# Patient Record
Sex: Female | Born: 1960 | Race: Black or African American | Hispanic: No | Marital: Single | Smoking: Current every day smoker
Health system: Southern US, Community
[De-identification: ages and names within clinical notes are randomized; demographics above are authoritative.]

## PROBLEM LIST (undated history)

## (undated) DIAGNOSIS — Z21 Asymptomatic human immunodeficiency virus [HIV] infection status: Secondary | ICD-10-CM

## (undated) DIAGNOSIS — G629 Polyneuropathy, unspecified: Secondary | ICD-10-CM

## (undated) DIAGNOSIS — B20 Human immunodeficiency virus [HIV] disease: Secondary | ICD-10-CM

## (undated) DIAGNOSIS — I509 Heart failure, unspecified: Secondary | ICD-10-CM

## (undated) HISTORY — PX: CARDIAC SURGERY: SHX584

---

## 2018-03-13 ENCOUNTER — Inpatient Hospital Stay (HOSPITAL_COMMUNITY)
Admission: EM | Admit: 2018-03-13 | Discharge: 2018-03-16 | DRG: 975 | Disposition: A | Discharge status: Home / Self Care | Payer: Medicaid - Out of State | Attending: Internal Medicine | Admitting: Internal Medicine

## 2018-03-13 ENCOUNTER — Encounter (HOSPITAL_COMMUNITY): Payer: Self-pay

## 2018-03-13 ENCOUNTER — Other Ambulatory Visit: Payer: Self-pay

## 2018-03-13 ENCOUNTER — Emergency Department (HOSPITAL_COMMUNITY): Payer: Medicaid - Out of State

## 2018-03-13 DIAGNOSIS — A419 Sepsis, unspecified organism: Principal | ICD-10-CM

## 2018-03-13 DIAGNOSIS — E860 Dehydration: Secondary | ICD-10-CM | POA: Diagnosis present

## 2018-03-13 DIAGNOSIS — F17210 Nicotine dependence, cigarettes, uncomplicated: Secondary | ICD-10-CM | POA: Diagnosis present

## 2018-03-13 DIAGNOSIS — E669 Obesity, unspecified: Secondary | ICD-10-CM | POA: Diagnosis present

## 2018-03-13 DIAGNOSIS — Z833 Family history of diabetes mellitus: Secondary | ICD-10-CM

## 2018-03-13 DIAGNOSIS — Z7984 Long term (current) use of oral hypoglycemic drugs: Secondary | ICD-10-CM

## 2018-03-13 DIAGNOSIS — Z720 Tobacco use: Secondary | ICD-10-CM | POA: Diagnosis present

## 2018-03-13 DIAGNOSIS — E785 Hyperlipidemia, unspecified: Secondary | ICD-10-CM | POA: Diagnosis present

## 2018-03-13 DIAGNOSIS — R0602 Shortness of breath: Secondary | ICD-10-CM

## 2018-03-13 DIAGNOSIS — Z6834 Body mass index (BMI) 34.0-34.9, adult: Secondary | ICD-10-CM

## 2018-03-13 DIAGNOSIS — Z21 Asymptomatic human immunodeficiency virus [HIV] infection status: Secondary | ICD-10-CM

## 2018-03-13 DIAGNOSIS — I509 Heart failure, unspecified: Secondary | ICD-10-CM

## 2018-03-13 DIAGNOSIS — J181 Lobar pneumonia, unspecified organism: Secondary | ICD-10-CM | POA: Diagnosis present

## 2018-03-13 DIAGNOSIS — E1129 Type 2 diabetes mellitus with other diabetic kidney complication: Secondary | ICD-10-CM | POA: Diagnosis present

## 2018-03-13 DIAGNOSIS — K219 Gastro-esophageal reflux disease without esophagitis: Secondary | ICD-10-CM | POA: Diagnosis present

## 2018-03-13 DIAGNOSIS — Z7982 Long term (current) use of aspirin: Secondary | ICD-10-CM

## 2018-03-13 DIAGNOSIS — N179 Acute kidney failure, unspecified: Secondary | ICD-10-CM | POA: Diagnosis present

## 2018-03-13 DIAGNOSIS — N183 Chronic kidney disease, stage 3 unspecified: Secondary | ICD-10-CM | POA: Diagnosis present

## 2018-03-13 DIAGNOSIS — Y95 Nosocomial condition: Secondary | ICD-10-CM | POA: Diagnosis present

## 2018-03-13 DIAGNOSIS — Z79899 Other long term (current) drug therapy: Secondary | ICD-10-CM

## 2018-03-13 DIAGNOSIS — E1122 Type 2 diabetes mellitus with diabetic chronic kidney disease: Secondary | ICD-10-CM

## 2018-03-13 DIAGNOSIS — I5022 Chronic systolic (congestive) heart failure: Secondary | ICD-10-CM | POA: Diagnosis present

## 2018-03-13 DIAGNOSIS — B20 Human immunodeficiency virus [HIV] disease: Secondary | ICD-10-CM | POA: Diagnosis present

## 2018-03-13 DIAGNOSIS — J189 Pneumonia, unspecified organism: Secondary | ICD-10-CM

## 2018-03-13 HISTORY — DX: Heart failure, unspecified: I50.9

## 2018-03-13 HISTORY — DX: Asymptomatic human immunodeficiency virus (hiv) infection status: Z21

## 2018-03-13 HISTORY — DX: Human immunodeficiency virus (HIV) disease: B20

## 2018-03-13 HISTORY — DX: Polyneuropathy, unspecified: G62.9

## 2018-03-13 MED ORDER — LACTATED RINGERS IV BOLUS (SEPSIS)
1000.0000 mL | Freq: Once | INTRAVENOUS | Status: AC
  Administered 2018-03-14: 1000 mL via INTRAVENOUS

## 2018-03-13 MED ORDER — LACTATED RINGERS IV BOLUS (SEPSIS): 1000.0000 mL | Freq: Once | INTRAVENOUS | Status: DC

## 2018-03-13 MED ORDER — ACETAMINOPHEN 325 MG PO TABS
650.0000 mg | ORAL_TABLET | Freq: Once | ORAL | Status: AC | PRN
  Administered 2018-03-13: 650 mg via ORAL
  Filled 2018-03-13: qty 2

## 2018-03-13 MED ORDER — SODIUM CHLORIDE 0.9 % IV SOLN
2.0000 g | INTRAVENOUS | Status: DC
  Administered 2018-03-14: 2 g via INTRAVENOUS
  Filled 2018-03-13: qty 20

## 2018-03-13 MED ORDER — SODIUM CHLORIDE 0.9 % IV SOLN
500.0000 mg | INTRAVENOUS | Status: DC
  Administered 2018-03-14 – 2018-03-15 (×3): 500 mg via INTRAVENOUS
  Filled 2018-03-13 (×3): qty 500

## 2018-03-13 NOTE — ED Notes (Signed)
This RN assisted and reviewed triage.

## 2018-03-13 NOTE — ED Notes (Signed)
RN attempted to try to start IV/blood work. RN consulted with coworker to try to get an IV placed and blood work drawn.

## 2018-03-13 NOTE — ED Triage Notes (Signed)
Pt c/o SHOB. Has had recent contact with family member who had been sick. Reports having chills at home and feeling flushed. This has been going on for four days. States chest is hurting from working hard to breathe. Has been taking robitussin and OTC meds to control symptoms at home.

## 2018-03-14 ENCOUNTER — Encounter (HOSPITAL_COMMUNITY): Payer: Self-pay | Admitting: Internal Medicine

## 2018-03-14 DIAGNOSIS — I5022 Chronic systolic (congestive) heart failure: Secondary | ICD-10-CM | POA: Diagnosis present

## 2018-03-14 DIAGNOSIS — Z720 Tobacco use: Secondary | ICD-10-CM | POA: Diagnosis present

## 2018-03-14 DIAGNOSIS — J189 Pneumonia, unspecified organism: Secondary | ICD-10-CM | POA: Diagnosis present

## 2018-03-14 DIAGNOSIS — E1129 Type 2 diabetes mellitus with other diabetic kidney complication: Secondary | ICD-10-CM | POA: Diagnosis present

## 2018-03-14 DIAGNOSIS — B20 Human immunodeficiency virus [HIV] disease: Secondary | ICD-10-CM | POA: Diagnosis present

## 2018-03-14 DIAGNOSIS — K219 Gastro-esophageal reflux disease without esophagitis: Secondary | ICD-10-CM | POA: Diagnosis not present

## 2018-03-14 DIAGNOSIS — N179 Acute kidney failure, unspecified: Secondary | ICD-10-CM | POA: Diagnosis present

## 2018-03-14 DIAGNOSIS — A419 Sepsis, unspecified organism: Secondary | ICD-10-CM | POA: Diagnosis present

## 2018-03-14 DIAGNOSIS — N183 Chronic kidney disease, stage 3 unspecified: Secondary | ICD-10-CM | POA: Diagnosis present

## 2018-03-14 LAB — RESPIRATORY PANEL BY PCR
ADENOVIRUS-RVPPCR: NOT DETECTED
Bordetella pertussis: NOT DETECTED
CORONAVIRUS HKU1-RVPPCR: NOT DETECTED
CORONAVIRUS NL63-RVPPCR: NOT DETECTED
Chlamydophila pneumoniae: NOT DETECTED
Coronavirus 229E: NOT DETECTED
Coronavirus OC43: NOT DETECTED
INFLUENZA A-RVPPCR: NOT DETECTED
Influenza B: NOT DETECTED
MYCOPLASMA PNEUMONIAE-RVPPCR: NOT DETECTED
Metapneumovirus: NOT DETECTED
Parainfluenza Virus 1: NOT DETECTED
Parainfluenza Virus 2: NOT DETECTED
Parainfluenza Virus 3: NOT DETECTED
Parainfluenza Virus 4: NOT DETECTED
Respiratory Syncytial Virus: NOT DETECTED
Rhinovirus / Enterovirus: DETECTED — AB

## 2018-03-14 LAB — COMPREHENSIVE METABOLIC PANEL
ALBUMIN: 3.9 g/dL (ref 3.5–5.0)
ALK PHOS: 56 U/L (ref 38–126)
ALT: 15 U/L (ref 0–44)
ANION GAP: 11 (ref 5–15)
AST: 24 U/L (ref 15–41)
BUN: 28 mg/dL — ABNORMAL HIGH (ref 6–20)
CALCIUM: 9.5 mg/dL (ref 8.9–10.3)
CO2: 26 mmol/L (ref 22–32)
Chloride: 100 mmol/L (ref 98–111)
Creatinine, Ser: 1.22 mg/dL — ABNORMAL HIGH (ref 0.44–1.00)
GFR calc Af Amer: 56 mL/min — ABNORMAL LOW (ref >60)
GFR calc non Af Amer: 49 mL/min — ABNORMAL LOW (ref >60)
GLUCOSE: 87 mg/dL (ref 70–99)
Potassium: 4.2 mmol/L (ref 3.5–5.1)
SODIUM: 137 mmol/L (ref 135–145)
Total Bilirubin: 1 mg/dL (ref 0.3–1.2)
Total Protein: 9.4 g/dL — ABNORMAL HIGH (ref 6.5–8.1)

## 2018-03-14 LAB — URINALYSIS, ROUTINE W REFLEX MICROSCOPIC
BILIRUBIN URINE: NEGATIVE
GLUCOSE, UA: NEGATIVE mg/dL
HGB URINE DIPSTICK: NEGATIVE
Ketones, ur: NEGATIVE mg/dL
Leukocytes, UA: NEGATIVE
Nitrite: NEGATIVE
PH: 6 (ref 5.0–8.0)
Protein, ur: NEGATIVE mg/dL
SPECIFIC GRAVITY, URINE: 1.011 (ref 1.005–1.030)

## 2018-03-14 LAB — CBC WITH DIFFERENTIAL/PLATELET
BASOS PCT: 1 %
Basophils Absolute: 0.1 10*3/uL (ref 0.0–0.1)
EOS ABS: 0 10*3/uL (ref 0.0–0.7)
Eosinophils Relative: 0 %
HEMATOCRIT: 36.4 % (ref 36.0–46.0)
HEMOGLOBIN: 12.2 g/dL (ref 12.0–15.0)
LYMPHS PCT: 29 %
Lymphs Abs: 3.6 10*3/uL (ref 0.7–4.0)
MCH: 29.5 pg (ref 26.0–34.0)
MCHC: 33.5 g/dL (ref 30.0–36.0)
MCV: 88.1 fL (ref 78.0–100.0)
Monocytes Absolute: 0.9 10*3/uL (ref 0.1–1.0)
Monocytes Relative: 7 %
NEUTROS ABS: 7.8 10*3/uL — AB (ref 1.7–7.7)
Neutrophils Relative %: 63 %
Platelets: 218 10*3/uL (ref 150–400)
RBC: 4.13 MIL/uL (ref 3.87–5.11)
RDW: 15 % (ref 11.5–15.5)
WBC: 12.4 10*3/uL — ABNORMAL HIGH (ref 4.0–10.5)

## 2018-03-14 LAB — GLUCOSE, CAPILLARY
GLUCOSE-CAPILLARY: 149 mg/dL — AB (ref 70–99)
GLUCOSE-CAPILLARY: 105 mg/dL — AB (ref 70–99)
Glucose-Capillary: 100 mg/dL — ABNORMAL HIGH (ref 70–99)

## 2018-03-14 LAB — BRAIN NATRIURETIC PEPTIDE: B Natriuretic Peptide: 1165.4 pg/mL — ABNORMAL HIGH (ref 0.0–100.0)

## 2018-03-14 LAB — TROPONIN I
Troponin I: <0.03 ng/mL (ref <0.03)
Troponin I: <0.03 ng/mL (ref <0.03)

## 2018-03-14 LAB — STREP PNEUMONIAE URINARY ANTIGEN: STREP PNEUMO URINARY ANTIGEN: NEGATIVE

## 2018-03-14 LAB — LACTIC ACID, PLASMA: LACTIC ACID, VENOUS: 1 mmol/L (ref 0.5–1.9)

## 2018-03-14 LAB — EXPECTORATED SPUTUM ASSESSMENT W REFEX TO RESP CULTURE

## 2018-03-14 LAB — MRSA PCR SCREENING: MRSA by PCR: POSITIVE — AB

## 2018-03-14 LAB — EXPECTORATED SPUTUM ASSESSMENT W GRAM STAIN, RFLX TO RESP C

## 2018-03-14 LAB — I-STAT CG4 LACTIC ACID, ED: Lactic Acid, Venous: 1.22 mmol/L (ref 0.5–1.9)

## 2018-03-14 LAB — PROCALCITONIN

## 2018-03-14 MED ORDER — INSULIN ASPART 100 UNIT/ML ~~LOC~~ SOLN
0.0000 [IU] | Freq: Three times a day (TID) | SUBCUTANEOUS | Status: DC
  Administered 2018-03-14: 1 [IU] via SUBCUTANEOUS
  Administered 2018-03-15: 2 [IU] via SUBCUTANEOUS

## 2018-03-14 MED ORDER — VANCOMYCIN HCL IN DEXTROSE 1-5 GM/200ML-% IV SOLN
1000.0000 mg | INTRAVENOUS | Status: DC
  Administered 2018-03-14: 1000 mg via INTRAVENOUS
  Filled 2018-03-14: qty 200

## 2018-03-14 MED ORDER — IPRATROPIUM-ALBUTEROL 0.5-2.5 (3) MG/3ML IN SOLN
3.0000 mL | RESPIRATORY_TRACT | Status: DC
  Administered 2018-03-14: 3 mL via RESPIRATORY_TRACT
  Filled 2018-03-14: qty 3

## 2018-03-14 MED ORDER — DM-GUAIFENESIN ER 30-600 MG PO TB12
1.0000 | ORAL_TABLET | Freq: Two times a day (BID) | ORAL | Status: DC
  Administered 2018-03-14: 1 via ORAL
  Filled 2018-03-14: qty 1

## 2018-03-14 MED ORDER — INFLUENZA VAC SPLIT QUAD 0.5 ML IM SUSY
0.5000 mL | PREFILLED_SYRINGE | INTRAMUSCULAR | Status: DC
  Filled 2018-03-14: qty 0.5

## 2018-03-14 MED ORDER — METOPROLOL SUCCINATE ER 25 MG PO TB24
12.5000 mg | ORAL_TABLET | Freq: Every day | ORAL | Status: DC
  Administered 2018-03-14 – 2018-03-16 (×3): 12.5 mg via ORAL
  Filled 2018-03-14 (×3): qty 1

## 2018-03-14 MED ORDER — MUPIROCIN 2 % EX OINT
1.0000 "application " | TOPICAL_OINTMENT | Freq: Two times a day (BID) | CUTANEOUS | Status: DC
  Administered 2018-03-14 – 2018-03-16 (×5): 1 via NASAL
  Filled 2018-03-14 (×3): qty 22

## 2018-03-14 MED ORDER — INSULIN ASPART 100 UNIT/ML ~~LOC~~ SOLN: 0.0000 [IU] | Freq: Every day | SUBCUTANEOUS | Status: DC

## 2018-03-14 MED ORDER — IPRATROPIUM-ALBUTEROL 0.5-2.5 (3) MG/3ML IN SOLN
3.0000 mL | Freq: Four times a day (QID) | RESPIRATORY_TRACT | Status: DC
  Administered 2018-03-14 – 2018-03-15 (×6): 3 mL via RESPIRATORY_TRACT
  Filled 2018-03-14 (×6): qty 3

## 2018-03-14 MED ORDER — ENOXAPARIN SODIUM 40 MG/0.4ML ~~LOC~~ SOLN
40.0000 mg | SUBCUTANEOUS | Status: DC
  Administered 2018-03-14 – 2018-03-15 (×2): 40 mg via SUBCUTANEOUS
  Filled 2018-03-14 (×3): qty 0.4

## 2018-03-14 MED ORDER — SACUBITRIL-VALSARTAN 24-26 MG PO TABS
1.0000 | ORAL_TABLET | Freq: Two times a day (BID) | ORAL | Status: DC
  Administered 2018-03-14 – 2018-03-15 (×4): 1 via ORAL
  Filled 2018-03-14 (×5): qty 1

## 2018-03-14 MED ORDER — ZOLPIDEM TARTRATE 5 MG PO TABS: 5.0000 mg | ORAL_TABLET | Freq: Every evening | ORAL | Status: DC | PRN

## 2018-03-14 MED ORDER — ACETAMINOPHEN 325 MG PO TABS
650.0000 mg | ORAL_TABLET | Freq: Four times a day (QID) | ORAL | Status: DC | PRN
  Administered 2018-03-14 (×2): 650 mg via ORAL
  Filled 2018-03-14 (×2): qty 2

## 2018-03-14 MED ORDER — ROSUVASTATIN CALCIUM 10 MG PO TABS
10.0000 mg | ORAL_TABLET | Freq: Every day | ORAL | Status: DC
  Administered 2018-03-14 – 2018-03-16 (×3): 10 mg via ORAL
  Filled 2018-03-14 (×3): qty 1

## 2018-03-14 MED ORDER — PANTOPRAZOLE SODIUM 40 MG PO TBEC
40.0000 mg | DELAYED_RELEASE_TABLET | Freq: Every day | ORAL | Status: DC
  Administered 2018-03-14 – 2018-03-16 (×3): 40 mg via ORAL
  Filled 2018-03-14 (×3): qty 1

## 2018-03-14 MED ORDER — HYDROXYZINE HCL 10 MG PO TABS
10.0000 mg | ORAL_TABLET | Freq: Three times a day (TID) | ORAL | Status: DC | PRN
  Filled 2018-03-14: qty 1

## 2018-03-14 MED ORDER — NICOTINE 21 MG/24HR TD PT24
21.0000 mg | MEDICATED_PATCH | Freq: Every day | TRANSDERMAL | Status: DC
  Administered 2018-03-14 – 2018-03-16 (×3): 21 mg via TRANSDERMAL
  Filled 2018-03-14 (×3): qty 1

## 2018-03-14 MED ORDER — SODIUM CHLORIDE 0.9 % IV SOLN
1.0000 g | Freq: Three times a day (TID) | INTRAVENOUS | Status: DC
  Administered 2018-03-14 – 2018-03-16 (×7): 1 g via INTRAVENOUS
  Filled 2018-03-14 (×9): qty 1

## 2018-03-14 MED ORDER — GUAIFENESIN ER 600 MG PO TB12
1200.0000 mg | ORAL_TABLET | Freq: Two times a day (BID) | ORAL | Status: DC
  Administered 2018-03-14 – 2018-03-15 (×3): 1200 mg via ORAL
  Filled 2018-03-14 (×4): qty 2

## 2018-03-14 MED ORDER — VANCOMYCIN HCL 10 G IV SOLR
2000.0000 mg | Freq: Once | INTRAVENOUS | Status: AC
  Administered 2018-03-14: 2000 mg via INTRAVENOUS
  Filled 2018-03-14: qty 2000

## 2018-03-14 MED ORDER — ALBUTEROL SULFATE (2.5 MG/3ML) 0.083% IN NEBU: 2.5000 mg | INHALATION_SOLUTION | RESPIRATORY_TRACT | Status: DC | PRN

## 2018-03-14 MED ORDER — CHLORHEXIDINE GLUCONATE CLOTH 2 % EX PADS: 6.0000 | MEDICATED_PAD | Freq: Every day | CUTANEOUS | Status: DC

## 2018-03-14 MED ORDER — ASPIRIN EC 81 MG PO TBEC
81.0000 mg | DELAYED_RELEASE_TABLET | Freq: Every day | ORAL | Status: DC
  Administered 2018-03-14 – 2018-03-16 (×3): 81 mg via ORAL
  Filled 2018-03-14 (×3): qty 1

## 2018-03-14 MED ORDER — BICTEGRAVIR-EMTRICITAB-TENOFOV 50-200-25 MG PO TABS
1.0000 | ORAL_TABLET | Freq: Every day | ORAL | Status: DC
  Administered 2018-03-14 – 2018-03-16 (×3): 1 via ORAL
  Filled 2018-03-14 (×3): qty 1

## 2018-03-14 MED ORDER — VANCOMYCIN HCL 10 G IV SOLR: 1250.0000 mg | INTRAVENOUS | Status: DC

## 2018-03-14 NOTE — ED Provider Notes (Signed)
[facility]-EMERGENCY DEPT Provider Note   CSN: [id number] Arrival date & time: 03/13/18  2203     History   Chief Complaint Chief Complaint  Patient presents with  . Fever    HPI Debbie Roberson is a 57 y.o. female.  The history is provided by the patient.  Fever   This is a new problem. The current episode started more than 2 days ago. The problem occurs daily. The problem has been gradually worsening. Associated symptoms include headaches, muscle aches and cough. Pertinent negatives include no diarrhea and no vomiting. Associated symptoms comments: Chest pain from coughing. Treatments tried: OTC meds. The treatment provided no relief.   Patient reports for the past week she has had intermittent fevers, chills, cough.  She reports to the cough she has been having headache and chest pain.  She also reports shortness of breath.  She does not wear oxygen at baseline.  She is here visiting family, she usually lives in [place]. She reports history of CHF and HIV, and is compliant with all her medications. No foreign travel Past Medical History:  Diagnosis Date  . CHF (congestive heart failure) (HCC)   . HIV (human immunodeficiency virus infection) (HCC)   . Neuropathy     There are no active problems to display for this patient.   Past Surgical History:  Procedure Laterality Date  . CARDIAC SURGERY       OB History   None      Home Medications    Prior to Admission medications   Medication Sig Start Date End Date Taking? Authorizing Provider  aspirin EC 81 MG tablet Take 81 mg by mouth daily. 03/14/17  Yes [provider]  bictegravir-emtricitabine-tenofovir AF (BIKTARVY) 50-200-25 MG TABS tablet Take 1 tablet by mouth daily.   Yes [provider]  furosemide (LASIX) 40 MG tablet Take 40 mg by mouth 2 (two) times daily.   Yes [provider]  glipiZIDE (GLUCOTROL XL) 2.5 MG 24 hr tablet Take 2.5 mg by mouth daily  with breakfast.   Yes [provider]  metFORMIN (GLUCOPHAGE) 500 MG tablet Take 500 mg by mouth 2 (two) times daily with a meal.   Yes [provider]  metoprolol succinate (TOPROL-XL) 25 MG 24 hr tablet Take 12.5 mg by mouth daily.   Yes [provider]  omeprazole (PRILOSEC) 40 MG capsule Take 40 mg by mouth 2 (two) times daily.   Yes [provider]  rosuvastatin (CRESTOR) 10 MG tablet Take 10 mg by mouth daily.   Yes [provider]  sacubitril-valsartan (ENTRESTO) 24-26 MG Take 1 tablet by mouth every 12 (twelve) hours.   Yes [provider]    Family History Family History  Problem Relation Age of Onset  . Diabetes Brother     Social History Social History   Tobacco Use  . Smoking status: Current Every Day Smoker    Packs/day: 0.50    Types: Cigarettes  . Smokeless tobacco: Never Used  . Tobacco comment: 3 cigarettes a day   Substance Use Topics  . Alcohol use: Not Currently  . Drug use: Not Currently     Allergies   Patient has no known allergies.   Review of Systems Review of Systems  Constitutional: Positive for chills and fever.  Respiratory: Positive for cough.   Gastrointestinal: Negative for diarrhea and vomiting.  Neurological: Positive for headaches.  All other systems reviewed and are negative.    Physical  Exam Updated Vital Signs BP 103/63 (BP Location: Left Arm)   Pulse 94   Temp (!) 102.6 F (39.2 C) (Rectal)   Resp (!) 31   Ht 1.626 m (5\' 4" )   Wt 90 kg   SpO2 91%   BMI 34.06 kg/m   Physical Exam  CONSTITUTIONAL: Chronically ill-appearing HEAD: Normocephalic/atraumatic EYES: EOMI ENMT: Mucous membranes moist NECK: supple no meningeal signs SPINE/BACK:entire spine nontender CV: S1/S2 noted LUNGS: Crackles noted in right base, mild tachypnea, mild hypoxia noted ABDOMEN: soft, nontender, no rebound or guarding, bowel sounds noted throughout abdomen GU:no cva tenderness NEURO: Pt  is awake/alert/appropriate, moves all extremitiesx4.  No facial droop.   EXTREMITIES: pulses normal/equal, full ROM SKIN: warm, color normal PSYCH: no abnormalities of mood noted, alert and oriented to situation  ED Treatments / Results  Labs (all labs ordered are listed, but only abnormal results are displayed) Labs Reviewed  COMPREHENSIVE METABOLIC PANEL - Abnormal; Notable for the following components:      Result Value   BUN 28 (*)    Creatinine, Ser 1.22 (*)    Total Protein 9.4 (*)    GFR calc non Af Amer 49 (*)    GFR calc Af Amer 56 (*)    All other components within normal limits  CBC WITH DIFFERENTIAL/PLATELET - Abnormal; Notable for the following components:   WBC 12.4 (*)    All other components within normal limits  BRAIN NATRIURETIC PEPTIDE - Abnormal; Notable for the following components:   B Natriuretic Peptide 1,165.4 (*)    All other components within normal limits  CULTURE, BLOOD (ROUTINE X 2)  CULTURE, BLOOD (ROUTINE X 2)  URINALYSIS, ROUTINE W REFLEX MICROSCOPIC  I-STAT CG4 LACTIC ACID, ED    EKG EKG Interpretation  Date/Time:  Friday March 13 2018 22:16:49 EDT Ventricular Rate:  98 PR Interval:    QRS Duration: 158 QT Interval:  408 QTC Calculation: 521 R Axis:   -71 Text Interpretation:  Sinus rhythm Left bundle branch block Baseline wander in lead(s) V2 No previous ECGs available Abnormal ekg Confirmed by Zadie Rhine ([id number]) on 03/13/2018 11:28:39 PM   Radiology Dg Chest 2 View  Result Date: 03/13/2018 CLINICAL DATA:  57 year old female with chest pain and shortness of breath. EXAM: CHEST - 2 VIEW COMPARISON:  None. FINDINGS: Probable small bilateral pleural effusions and associated atelectatic changes of the lung bases versus infiltrate. No pneumothorax. There is cardiomegaly. No acute osseous pathology. IMPRESSION: 1. Small bilateral pleural effusions and bibasilar atelectasis versus infiltrate. 2. Cardiomegaly. Electronically Signed    By: Elgie Collard M.D.   On: 03/13/2018 23:44    Procedures Procedures  CRITICAL CARE Performed by: Joya Gaskins Total critical care time: 35 minutes Critical care time was exclusive of separately billable procedures and treating other patients. Critical care was necessary to treat or prevent imminent or life-threatening deterioration. Critical care was time spent personally by me on the following activities: development of treatment plan with patient and/or surrogate as well as nursing, discussions with consultants, evaluation of patient's response to treatment, examination of patient, obtaining history from patient or surrogate, ordering and performing treatments and interventions, ordering and review of laboratory studies, ordering and review of radiographic studies, pulse oximetry and re-evaluation of patient's condition. Patient with sepsis and pneumonia requiring IV fluids and IV antibiotics.  Patient required admission.  Patient also has new oxygen requirement  Medications Ordered in ED Medications  cefTRIAXone (ROCEPHIN) 2 g in sodium chloride 0.9 % 100 mL  IVPB (0 g Intravenous Stopped 03/14/18 0139)  azithromycin (ZITHROMAX) 500 mg in sodium chloride 0.9 % 250 mL IVPB (0 mg Intravenous Stopped 03/14/18 0219)  acetaminophen (TYLENOL) tablet 650 mg (650 mg Oral Given 03/13/18 2352)  lactated ringers bolus 1,000 mL (0 mLs Intravenous Stopped 03/14/18 0208)     Initial Impression / Assessment and Plan / ED Course  I have reviewed the triage vital signs and the nursing notes.  Pertinent labs & imaging results that were available during my care of the patient were reviewed by me and considered in my medical decision making (see chart for details).     12:51 AM Patient with history of HIV and CHF presents with fever cough.  This appears likely due to pneumonia after reviewing CXR.  Code sepsis has been called. 2:28 AM Lactate is normal, blood pressure has stabilized.  Patient is  feeling improved.  She does have new oxygen requirement.  She will need to be admitted for pneumonia.  Discussed with Dr. Clyde Lundborg for admission Patient received 1 L of lactated Ringer's  Final Clinical Impressions(s) / ED Diagnoses   Final diagnoses:  Sepsis, due to unspecified organism  Community acquired pneumonia of right lower lobe of lung [facility])    ED Discharge Orders    None       Zadie Rhine, MD 03/14/18 [phone]

## 2018-03-14 NOTE — ED Notes (Signed)
Attempted to call report. Was requested to call back in 10 mins.

## 2018-03-14 NOTE — Progress Notes (Signed)
Pharmacy Antibiotic Note  Debbie Roberson is a 57 y.o. female admitted on 03/13/2018 with pneumonia.  Pharmacy has been consulted for Vancomycin dosing.  Plan: Cefepime 1gm iv q8hr  Vancomycin 2gm iv x1, then 1gm q24  Goal AUC = 400 - 500 for all indications, except meningitis (goal AUC > 500 and Cmin 15-20 mcg/mL)   Height: 5\' 4"  (162.6 cm) Weight: 198 lb 6.6 oz (90 kg) IBW/kg (Calculated) : 54.7  Temp (24hrs), Avg:100.3 F (37.9 C), Min:99 F (37.2 C), Max:102.6 F (39.2 C)  Recent Labs  Lab 03/14/18 0056 03/14/18 0111  WBC 12.4*  --   CREATININE 1.22*  --   LATICACIDVEN  --  1.22    Estimated Creatinine Clearance: 55.9 mL/min (A) (by C-G formula based on SCr of 1.22 mg/dL (H)).    No Known Allergies  Antimicrobials this admission: Biktarvy resumed Ceftriaxone 2gm x1 Vancomycin 9/28 >> Cefepime 9/28 >>  Azithromycin 9/28>>   Dose adjustments this admission:  Microbiology results: Microbiology results:  9/28 BCx: sent 9/28 Resp panel: sent 9/28 MRSA PCR: sent 9/28 Sputum: sent         Strep pneumo/Legionella: ordered  Thank you for allowing pharmacy to be a part of this patient's care.  Otho Bellows PharmD Pager [phone] 03/14/2018, 8:35 AM

## 2018-03-14 NOTE — ED Notes (Signed)
Ham sandwich given to pt.

## 2018-03-14 NOTE — ED Notes (Addendum)
Lilibeth RN in progress with starting IV's.

## 2018-03-14 NOTE — Progress Notes (Signed)
The patient was admitted early this morning after midnight and H&P has been reviewed and I am in current agreement with assessment and plan done by Dr. Lorretta Harp.  Additional changes the plan of care been made accordingly.  Patient is a 57 year old obese African American female with past medical history significant for chronic systolic CHF, hyperlipidemia, diabetes mellitus type 2, GERD, HIV, tobacco abuse, CKD stage III, and other comorbidities who presents with a cough, shortness breath, chest pain, and fevers and chills.  She was found to be febrile in the ED and chest x-ray was done because of her shortness of breath and cough and revealed bilateral basilar infiltrate and cardiomegaly.  She is admitted to the stepdown unit because of her blood pressures being low.  Of note she was hospitalized from the end of June to the drainage left due to CHF exacerbation.  Patient was admitted for sepsis due to H CAP pneumonia as she has been recently in the hospital and she was admitted to stepdown unit and was placed on IV vancomycin IV cefepime.  She was given IV azithromycin in the ED.  She was placed on albuterol nebs and given 1 L of fluid in the ED followed by normal saline at rate of 125 mL/hr which is now stopped.  Patient's blood pressure is improved so her home Sherryll Burger is resumed but will continue hold home Lasix at this time.  Continue monitor with patient's volume status and clinical response to intervention.  Her Mucinex was increased and she is also given a flutter valve and incentive spirometer.  We will repeat blood work and imaging in the a.m. and continue current plan of care.

## 2018-03-14 NOTE — Progress Notes (Signed)
ED TO INPATIENT HANDOFF REPORT  Name/Age/Gender Debbie Roberson 57 y.o. female  Code Status    Code Status Orders  (From admission, onward)         Start     Ordered   03/14/18 0347  Full code  Continuous     03/14/18 0347        Code Status History    This patient has a current code status but no historical code status.    Advance Directive Documentation     Most Recent Value  Type of Advance Directive  Healthcare Power of Attorney  Pre-existing out of facility DNR order (yellow form or pink MOST form)  -  "MOST" Form in Place?  -      Home/SNF/Other Home  Chief Complaint Trouble Breathing/Chest Pain Congestive Heart Failure/Fluid Build-Up  Level of Care/Admitting Diagnosis ED Disposition    ED Disposition Condition [place]: [facility] [100102]  Level of Care: Stepdown [14]  Admit to SDU based on following criteria: Hemodynamic compromise or significant risk of instability:  Patient requiring short term acute titration and management of vasoactive drips, and invasive monitoring (i.e., CVP and Arterial line).  Diagnosis: HCAP (healthcare-associated pneumonia) [297989]  Admitting Physician: Ivor Costa [[id number]]  Attending Physician: Ivor Costa [id number]  Estimated length of stay: past midnight tomorrow  Certification:: I certify this patient will need inpatient services for at least 2 midnights  PT Class (Do Not Modify): Inpatient [101]  PT Acc Code (Do Not Modify): Private [1]       Medical History Past Medical History:  Diagnosis Date  . CHF (congestive heart failure) ([place])   . HIV (human immunodeficiency virus infection) ([place])   . Neuropathy     Allergies No Known Allergies  IV Location/Drains/Wounds Patient Lines/Drains/Airways Status   Active Line/Drains/Airways    Name:   Placement date:   Placement time:   Site:   Days:   Peripheral IV 03/14/18 Right Hand   03/14/18    0045    Hand   less than 1   Peripheral IV 03/14/18 Left Hand   03/14/18    0105    Hand   less than 1          Labs/Imaging Results for orders placed or performed during the hospital encounter of 03/13/18 (from the past 48 hour(s))  Comprehensive metabolic panel     Status: Abnormal   Collection Time: 03/14/18 12:56 AM  Result Value Ref Range   Sodium 137 135 - 145 mmol/L   Potassium 4.2 3.5 - 5.1 mmol/L   Chloride 100 98 - 111 mmol/L   CO2 26 22 - 32 mmol/L   Glucose, Bld 87 70 - 99 mg/dL   BUN 28 (H) 6 - 20 mg/dL   Creatinine, Ser 1.22 (H) 0.44 - 1.00 mg/dL   Calcium 9.5 8.9 - 10.3 mg/dL   Total Protein 9.4 (H) 6.5 - 8.1 g/dL   Albumin 3.9 3.5 - 5.0 g/dL   AST 24 15 - 41 U/L   ALT 15 0 - 44 U/L   Alkaline Phosphatase 56 38 - 126 U/L   Total Bilirubin 1.0 0.3 - 1.2 mg/dL   GFR calc non Af Amer 49 (L) >60 mL/min   GFR calc Af Amer 56 (L) >60 mL/min    Comment: (NOTE) The eGFR has been calculated using the CKD EPI equation. This calculation has not been validated in all clinical situations. eGFR's persistently <60 mL/min  signify possible Chronic Kidney Disease.    Anion gap 11 5 - 15    Comment: Performed at [facility], [place] [address], [place], [place] [address]  CBC WITH DIFFERENTIAL     Status: Abnormal   Collection Time: 03/14/18 12:56 AM  Result Value Ref Range   WBC 12.4 (H) 4.0 - 10.5 K/uL   RBC 4.13 3.87 - 5.11 MIL/uL   Hemoglobin 12.2 12.0 - 15.0 g/dL   HCT 36.4 36.0 - 46.0 %   MCV 88.1 78.0 - 100.0 fL   MCH 29.5 26.0 - 34.0 pg   MCHC 33.5 30.0 - 36.0 g/dL   RDW 15.0 11.5 - 15.5 %   Platelets 218 150 - 400 K/uL   Neutrophils Relative % 63 %   Lymphocytes Relative 29 %   Monocytes Relative 7 %   Eosinophils Relative 0 %   Basophils Relative 1 %   Neutro Abs 7.8 (H) 1.7 - 7.7 K/uL   Lymphs Abs 3.6 0.7 - 4.0 K/uL   Monocytes Absolute 0.9 0.1 - 1.0 K/uL   Eosinophils Absolute 0.0 0.0 - 0.7 K/uL   Basophils Absolute 0.1 0.0 - 0.1 K/uL   WBC Morphology DOHLE BODIES      Comment: Performed at [facility], [place] [address], [place], [place] [address]  Brain natriuretic peptide     Status: Abnormal   Collection Time: 03/14/18 12:56 AM  Result Value Ref Range   B Natriuretic Peptide 1,165.4 (H) 0.0 - 100.0 pg/mL    Comment: Performed at [facility], [place] [address] [place], [place] [address]  I-Stat CG4 Lactic Acid, ED  (not at  [facility])     Status: None   Collection Time: 03/14/18  1:11 AM  Result Value Ref Range   Lactic Acid, Venous 1.22 0.5 - 1.9 mmol/L  Urinalysis, Routine w reflex microscopic     Status: None   Collection Time: 03/14/18  2:19 AM  Result Value Ref Range   Color, Urine YELLOW YELLOW   APPearance CLEAR CLEAR   Specific Gravity, Urine 1.011 1.005 - 1.030   pH 6.0 5.0 - 8.0   Glucose, UA NEGATIVE NEGATIVE mg/dL   Hgb urine dipstick NEGATIVE NEGATIVE   Bilirubin Urine NEGATIVE NEGATIVE   Ketones, ur NEGATIVE NEGATIVE mg/dL   Protein, ur NEGATIVE NEGATIVE mg/dL   Nitrite NEGATIVE NEGATIVE   Leukocytes, UA NEGATIVE NEGATIVE    Comment: Performed at [facility] [address] [place], [place] [address]   Dg Chest 2 View  Result Date: 03/13/2018 CLINICAL DATA:  57 year old female with chest pain and shortness of breath. EXAM: CHEST - 2 VIEW COMPARISON:  None. FINDINGS: Probable small bilateral pleural effusions and associated atelectatic changes of the lung bases versus infiltrate. No pneumothorax. There is cardiomegaly. No acute osseous pathology. IMPRESSION: 1. Small bilateral pleural effusions and bibasilar atelectasis versus infiltrate. 2. Cardiomegaly. Electronically Signed   By: Anner Crete M.D.   On: 03/13/2018 23:44    Pending Labs Unresulted Labs (From admission, onward)    Start     Ordered   03/14/18 0348  Legionella Pneumophila Serogp 1 Ur Ag  Once,   R     03/14/18 0347   03/14/18 0345  Lactic acid, plasma  Once,   STAT     03/14/18 0344   03/14/18 0345   Procalcitonin  STAT,   R     03/14/18 0344   03/14/18 0345  Culture, sputum-assessment  Once,   R     03/14/18 0347   03/14/18 0345  Gram stain  Once,   R     03/14/18 0347   03/14/18 0345  Strep pneumoniae urinary antigen  Once,   R     03/14/18 0347   03/14/18 0344  Respiratory Panel by PCR  (Respiratory virus panel)  Once,   R     03/14/18 0343   03/13/18 2315  Blood Culture (routine x 2)  BLOOD CULTURE X 2,   STAT     03/13/18 2315          Vitals/Pain Today's Vitals   03/14/18 0231 03/14/18 0330 03/14/18 0343 03/14/18 0408  BP: (!) 73/50  (!) 80/64 (!) 82/56  Pulse: 92 88 86 89  Resp: (!) 21 18 (!) 22 (!) 21  Temp:      TempSrc:      SpO2: 97% 95% 97% 96%  Weight:      Height:      PainSc:        Isolation Precautions Droplet precaution  Medications Medications  azithromycin (ZITHROMAX) 500 mg in sodium chloride 0.9 % 250 mL IVPB (0 mg Intravenous Stopped 03/14/18 0219)  aspirin EC tablet 81 mg (has no administration in time range)  bictegravir-emtricitabine-tenofovir AF (BIKTARVY) 50-200-25 MG per tablet 1 tablet (has no administration in time range)  metoprolol succinate (TOPROL-XL) 24 hr tablet 12.5 mg (has no administration in time range)  rosuvastatin (CRESTOR) tablet 10 mg (has no administration in time range)  pantoprazole (PROTONIX) EC tablet 40 mg (has no administration in time range)  albuterol (PROVENTIL) (2.5 MG/3ML) 0.083% nebulizer solution 2.5 mg (has no administration in time range)  dextromethorphan-guaiFENesin (MUCINEX DM) 30-600 MG per 12 hr tablet 1 tablet (has no administration in time range)  nicotine (NICODERM CQ - dosed in mg/24 hours) patch 21 mg (has no administration in time range)  enoxaparin (LOVENOX) injection 40 mg (has no administration in time range)  ceFEPIme (MAXIPIME) 1 g in sodium chloride 0.9 % 100 mL IVPB (has no administration in time range)  vancomycin (VANCOCIN) 2,000 mg in sodium chloride 0.9 % 500 mL IVPB (has no  administration in time range)  ipratropium-albuterol (DUONEB) 0.5-2.5 (3) MG/3ML nebulizer solution 3 mL (has no administration in time range)  acetaminophen (TYLENOL) tablet 650 mg (650 mg Oral Given 03/13/18 2352)  lactated ringers bolus 1,000 mL (0 mLs Intravenous Stopped 03/14/18 0208)    Mobility walks

## 2018-03-14 NOTE — H&P (Signed)
History and Physical    Debbie Roberson WJX:[id number] DOB: 1961/02/01 DOA: 03/13/2018  Referring MD/NP/PA:   PCP: System, Pcp Not In   Patient coming from:  The patient is coming from home.  At baseline, pt is independent for most of ADL.   Chief Complaint: Cough, shortness of breath, chest pain, fever and chills  HPI: Debbie Roberson is a 57 y.o. female with medical history significant of CHF (EF of 30% per pt's report), hyperlipidemia, diabetes mellitus, GERD, HIV, tobacco abuse, CKD 3, who presents with cough, shortness breath, chest pain, fever and chills.  Patient states that she has been having cough, shortness breath, chest pain in the past 4 days, which has worsened today.  It is associated with fever and chills.  She has temperature 102.6 in ED.  The chest pain is located in the substernal area, constant, 5 out of 10 severity, sharp, radiating to the both side of ribs, pleuritic, aggravated by deep breath and coughing.  She coughs up yellow-colored sputum.  Denies nausea vomiting, diarrhea, abdominal pain.  No symptoms of UTI or unilateral weakness.  She states that she had sick contact with her brother at home recently.  Patient states that her blood pressure has been running low, normally with SBP between upper 80s-lower 90s.  Patient states that she was in the hospital from end of June to beginning of July due to CHF exacerbation.  ED Course: pt was found to have WBC 12.4, BNP 1165, lactic acid of 1.22, pending urinalysis, slightly worsening renal function, temperature 102.6, soft blood pressure, no tachycardia, has tachypnea, oxygen saturation 91 to 95% on room air.  Chest x-ray showed bilateral basilar infiltration and cardiomegaly.  Patient is admitted to stepdown as inpatient.  Review of Systems:   General: has fevers, chills, no body weight gain, has fatigue HEENT: no blurry vision, hearing changes or sore throat Respiratory: has dyspnea, coughing, no wheezing CV: has chest  pain, no palpitations GI: no nausea, vomiting, abdominal pain, diarrhea, constipation GU: no dysuria, burning on urination, increased urinary frequency, hematuria  Ext: has trace leg edema Neuro: no unilateral weakness, numbness, or tingling, no vision change or hearing loss Skin: no rash, no skin tear. MSK: No muscle spasm, no deformity, no limitation of range of movement in spin Heme: No easy bruising.  Travel history: No recent long distant travel.  Allergy: No Known Allergies  Past Medical History:  Diagnosis Date  . CHF (congestive heart failure) (HCC)   . HIV (human immunodeficiency virus infection) (HCC)   . Neuropathy     Past Surgical History:  Procedure Laterality Date  . CARDIAC SURGERY      Social History:  reports that she has been smoking cigarettes. She has been smoking about 0.50 packs per day. She has never used smokeless tobacco. She reports that she drank alcohol. She reports that she has current or past drug history.  Family History:  Family History  Problem Relation Age of Onset  . Diabetes Brother      Prior to Admission medications   Medication Sig Start Date End Date Taking? Authorizing Provider  aspirin EC 81 MG tablet Take 81 mg by mouth daily. 03/14/17  Yes [provider]  bictegravir-emtricitabine-tenofovir AF (BIKTARVY) 50-200-25 MG TABS tablet Take 1 tablet by mouth daily.   Yes [provider]  furosemide (LASIX) 40 MG tablet Take 40 mg by mouth 2 (two) times daily.   Yes [provider]  glipiZIDE (GLUCOTROL XL) 2.5 MG 24 hr  tablet Take 2.5 mg by mouth daily with breakfast.   Yes [provider]  metFORMIN (GLUCOPHAGE) 500 MG tablet Take 500 mg by mouth 2 (two) times daily with a meal.   Yes [provider]  metoprolol succinate (TOPROL-XL) 25 MG 24 hr tablet Take 12.5 mg by mouth daily.   Yes [provider]  omeprazole (PRILOSEC) 40 MG capsule Take 40 mg by mouth 2 (two) times daily.   Yes  [provider]  rosuvastatin (CRESTOR) 10 MG tablet Take 10 mg by mouth daily.   Yes [provider]  sacubitril-valsartan (ENTRESTO) 24-26 MG Take 1 tablet by mouth every 12 (twelve) hours.   Yes [provider]    Physical Exam: Vitals:   03/14/18 0231 03/14/18 0330 03/14/18 0343 03/14/18 0408  BP: (!) 73/50  (!) 80/64 (!) 82/56  Pulse: 92 88 86 89  Resp: (!) 21 18 (!) 22 (!) 21  Temp:      TempSrc:      SpO2: 97% 95% 97% 96%  Weight:      Height:       General: Not in acute distress HEENT:       Eyes: PERRL, EOMI, no scleral icterus.       ENT: No discharge from the ears and nose, no pharynx injection, no tonsillar enlargement.        Neck: No JVD, no bruit, no mass felt. Heme: No neck lymph node enlargement. Cardiac: S1/S2, RRR, No murmurs, No gallops or rubs. Respiratory: No rales, wheezing, rhonchi or rubs.  Has coarse breathing sound bilaterally. GI: Soft, nondistended, nontender, no rebound pain, no organomegaly, BS present. GU: No hematuria Ext: has trace leg edema bilaterally. 2+DP/PT pulse bilaterally. Musculoskeletal: No joint deformities, No joint redness or warmth, no limitation of ROM in spin. Skin: No rashes.  Neuro: Alert, oriented X3, cranial nerves II-XII grossly intact, moves all extremities normally.  Psych: Patient is not psychotic, no suicidal or hemocidal ideation.  Labs on Admission: I have personally reviewed following labs and imaging studies  CBC: Recent Labs  Lab 03/14/18 0056  WBC 12.4*  NEUTROABS 7.8*  HGB 12.2  HCT 36.4  MCV 88.1  PLT 218   Basic Metabolic Panel: Recent Labs  Lab 03/14/18 0056  NA 137  K 4.2  CL 100  CO2 26  GLUCOSE 87  BUN 28*  CREATININE 1.22*  CALCIUM 9.5   GFR: Estimated Creatinine Clearance: 55.9 mL/min (A) (by C-G formula based on SCr of 1.22 mg/dL (H)). Liver Function Tests: Recent Labs  Lab 03/14/18 0056  AST 24  ALT 15  ALKPHOS 56  BILITOT 1.0  PROT 9.4*  ALBUMIN  3.9   No results for input(s): LIPASE, AMYLASE in the last 168 hours. No results for input(s): AMMONIA in the last 168 hours. Coagulation Profile: No results for input(s): INR, PROTIME in the last 168 hours. Cardiac Enzymes: No results for input(s): CKTOTAL, CKMB, CKMBINDEX, TROPONINI in the last 168 hours. BNP (last 3 results) No results for input(s): PROBNP in the last 8760 hours. HbA1C: No results for input(s): HGBA1C in the last 72 hours. CBG: No results for input(s): GLUCAP in the last 168 hours. Lipid Profile: No results for input(s): CHOL, HDL, LDLCALC, TRIG, CHOLHDL, LDLDIRECT in the last 72 hours. Thyroid Function Tests: No results for input(s): TSH, T4TOTAL, FREET4, T3FREE, THYROIDAB in the last 72 hours. Anemia Panel: No results for input(s): VITAMINB12, FOLATE, FERRITIN, TIBC, IRON, RETICCTPCT in the last 72 hours. Urine analysis:  Component Value Date/Time   COLORURINE YELLOW 03/14/2018 0219   APPEARANCEUR CLEAR 03/14/2018 0219   LABSPEC 1.011 03/14/2018 0219   PHURINE 6.0 03/14/2018 0219   GLUCOSEU NEGATIVE 03/14/2018 0219   HGBUR NEGATIVE 03/14/2018 0219   BILIRUBINUR NEGATIVE 03/14/2018 0219   KETONESUR NEGATIVE 03/14/2018 0219   PROTEINUR NEGATIVE 03/14/2018 0219   NITRITE NEGATIVE 03/14/2018 0219   LEUKOCYTESUR NEGATIVE 03/14/2018 0219   Sepsis Labs: @LABRCNTIP (procalcitonin:4,lacticidven:4) )No results found for this or any previous visit (from the past 240 hour(s)).   Radiological Exams on Admission: Dg Chest 2 View  Result Date: 03/13/2018 CLINICAL DATA:  57 year old female with chest pain and shortness of breath. EXAM: CHEST - 2 VIEW COMPARISON:  None. FINDINGS: Probable small bilateral pleural effusions and associated atelectatic changes of the lung bases versus infiltrate. No pneumothorax. There is cardiomegaly. No acute osseous pathology. IMPRESSION: 1. Small bilateral pleural effusions and bibasilar atelectasis versus infiltrate. 2. Cardiomegaly.  Electronically Signed   By: Elgie Collard M.D.   On: 03/13/2018 23:44     EKG: Independently reviewed.   Assessment/Plan Principal Problem:   HCAP (healthcare-associated pneumonia) Active Problems:   Chronic systolic CHF (congestive heart failure) (HCC)   HIV (human immunodeficiency virus infection) (HCC)   Type II diabetes mellitus with renal manifestations (HCC)   GERD (gastroesophageal reflux disease)   Tobacco abuse   Sepsis (HCC)   Acute renal failure superimposed on stage 3 chronic kidney disease (HCC)   Sepsis due to HCAP (healthcare-associated pneumonia): Patiet meets criteria for sepsis with leukocytosis, tachypnea and fever.  Lactic acid is normal.  Blood pressures are soft, but currently mental status normal and hemodynamically stable.  Her low blood pressure is likely a chronic issue.  - will admit to SDU as inpt - IV Vancomycin and cefepime, azithromycin (patient received 1 dose of Rocephin in ED) - Mucinex for cough  - prn Albuterol Nebs, DuoNeb for SOB - Urine legionella and S. pneumococcal antigen - Follow up blood culture x2, sputum culture and respiratory virus panel - will get Procalcitonin and trend lactic acid level per sepsis protocol - IVF: 1L of Ringer's solution in ED, followed by 125 mL per hour of NS (patient has EF 30%, limiting aggressive IV fluids treatment) - f/u trop x 3 due to chest pain  Chronic systolic CHF (congestive heart failure) (HCC): No 2D echo on record.  Per patient report, patient has EF of 30%.  Patient has trace leg edema, BNP 1165, slightly fluid overloaded, given sepsis, will hold diuretics. -Hold Lasix and Entresto due to soft blood pressure  HIV (human immunodeficiency virus infection) (HCC): CD4 =707 on 09/19/2016 -Continue home HIV medications  Type II diabetes mellitus with renal manifestations (HCC): Last A1c 6.2 on 03/13/17, well controled. Patient is taking glipizide and metformin at home -SSI  GERD (gastroesophageal  reflux disease): -Protonix  Tobacco abuse: -Did counseling about importance of quitting smoking -Nicotine patch  AoCKD-III: Baseline Cre is 0.8-1.0, pt's Cre is 1.22 and BUN 28 on admission. Likely due to dehydration and continuation of ACEI and diuretics - IVF as above - Follow up renal function by BMP - Hold Entresto and Lasix   Inpatient status:  # Patient requires inpatient status due to high intensity of service, high risk for further deterioration and high frequency of surveillance required.  I certify that at the point of admission it is my clinical judgment that the patient will require inpatient hospital care spanning beyond 2 midnights from the point of admission.  Marland Kitchen  This patient has multiple chronic comorbidities including CHF (EF of 30% per pt's report), hyperlipidemia, diabetes mellitus, GERD, HIV, tobacco abuse, CKD 3 . Now patient has presenting symptoms include cough, shortness breath, chest pain, fever and chills. . The worrisome physical exam findings include coarse breathing sound on auscultation. . The initial radiographic and laboratory data are worrisome because of leukocytosis, worsening renal function, sepsis . Current medical needs: please see my assessment and plan    DVT ppx: SQ Lovenox Code Status: Full code Family Communication: None at bed side.   Disposition Plan:  Anticipate discharge back to previous home environment Consults called:  none Admission status:  SDU/inpation       Date of Service 03/14/2018    Lorretta Harp [facility] Pager [phone]  If 7PM-7AM, please contact night-coverage [email or web address] Password [facility] 03/14/2018, 5:27 AM

## 2018-03-14 NOTE — Progress Notes (Signed)
PT demonstrated hands on understanding of Flutter device. 

## 2018-03-14 NOTE — Progress Notes (Signed)
Pharmacy Antibiotic Note  Debbie Roberson is a 57 y.o. female admitted on 03/13/2018 with pneumonia.  Pharmacy has been consulted for Vancomycin dosing.  Plan: Cefepime 1gm iv q8hr  Vancomycin 2gm iv x1, then 1250mg  iv q36hr  Goal AUC = 400 - 500 for all indications, except meningitis (goal AUC > 500 and Cmin 15-20 mcg/mL)   Height: 5\' 4"  (162.6 cm) Weight: 198 lb 6.6 oz (90 kg) IBW/kg (Calculated) : 54.7  Temp (24hrs), Avg:101 F (38.3 C), Min:99.4 F (37.4 C), Max:102.6 F (39.2 C)  Recent Labs  Lab 03/14/18 0056 03/14/18 0111  WBC 12.4*  --   CREATININE 1.22*  --   LATICACIDVEN  --  1.22    Estimated Creatinine Clearance: 55.9 mL/min (A) (by C-G formula based on SCr of 1.22 mg/dL (H)).    No Known Allergies  Antimicrobials this admission: Vancomycin 03/14/2018 >> Cefepime 03/14/2018 >>   Dose adjustments this admission: -  Microbiology results: -  Thank you for allowing pharmacy to be a part of this patient's care.  Aleene Davidson Crowford 03/14/2018 6:19 AM

## 2018-03-15 ENCOUNTER — Inpatient Hospital Stay (HOSPITAL_COMMUNITY): Payer: Medicaid - Out of State

## 2018-03-15 DIAGNOSIS — N179 Acute kidney failure, unspecified: Secondary | ICD-10-CM | POA: Diagnosis not present

## 2018-03-15 DIAGNOSIS — E1122 Type 2 diabetes mellitus with diabetic chronic kidney disease: Secondary | ICD-10-CM

## 2018-03-15 DIAGNOSIS — B348 Other viral infections of unspecified site: Secondary | ICD-10-CM

## 2018-03-15 DIAGNOSIS — E785 Hyperlipidemia, unspecified: Secondary | ICD-10-CM

## 2018-03-15 DIAGNOSIS — J189 Pneumonia, unspecified organism: Secondary | ICD-10-CM | POA: Diagnosis not present

## 2018-03-15 DIAGNOSIS — I5022 Chronic systolic (congestive) heart failure: Secondary | ICD-10-CM | POA: Diagnosis not present

## 2018-03-15 DIAGNOSIS — A419 Sepsis, unspecified organism: Secondary | ICD-10-CM | POA: Diagnosis not present

## 2018-03-15 LAB — CBC WITH DIFFERENTIAL/PLATELET
BASOS ABS: 0 10*3/uL (ref 0.0–0.1)
BASOS PCT: 0 %
EOS ABS: 0.1 10*3/uL (ref 0.0–0.7)
EOS PCT: 1 %
HCT: 33.5 % — ABNORMAL LOW (ref 36.0–46.0)
Hemoglobin: 10.9 g/dL — ABNORMAL LOW (ref 12.0–15.0)
Lymphocytes Relative: 26 %
Lymphs Abs: 2.3 10*3/uL (ref 0.7–4.0)
MCH: 29 pg (ref 26.0–34.0)
MCHC: 32.5 g/dL (ref 30.0–36.0)
MCV: 89.1 fL (ref 78.0–100.0)
MONO ABS: 0.7 10*3/uL (ref 0.1–1.0)
Monocytes Relative: 8 %
Neutro Abs: 5.6 10*3/uL (ref 1.7–7.7)
Neutrophils Relative %: 65 %
PLATELETS: 192 10*3/uL (ref 150–400)
RBC: 3.76 MIL/uL — AB (ref 3.87–5.11)
RDW: 14.9 % (ref 11.5–15.5)
WBC: 8.6 10*3/uL (ref 4.0–10.5)

## 2018-03-15 LAB — COMPREHENSIVE METABOLIC PANEL
ALT: 11 U/L (ref 0–44)
AST: 17 U/L (ref 15–41)
Albumin: 3 g/dL — ABNORMAL LOW (ref 3.5–5.0)
Alkaline Phosphatase: 46 U/L (ref 38–126)
Anion gap: 7 (ref 5–15)
BUN: 30 mg/dL — AB (ref 6–20)
CHLORIDE: 106 mmol/L (ref 98–111)
CO2: 25 mmol/L (ref 22–32)
Calcium: 8.2 mg/dL — ABNORMAL LOW (ref 8.9–10.3)
Creatinine, Ser: 1.15 mg/dL — ABNORMAL HIGH (ref 0.44–1.00)
GFR calc non Af Amer: 52 mL/min — ABNORMAL LOW (ref >60)
Glucose, Bld: 121 mg/dL — ABNORMAL HIGH (ref 70–99)
POTASSIUM: 4.4 mmol/L (ref 3.5–5.1)
SODIUM: 138 mmol/L (ref 135–145)
TOTAL PROTEIN: 7.4 g/dL (ref 6.5–8.1)
Total Bilirubin: 0.5 mg/dL (ref 0.3–1.2)

## 2018-03-15 LAB — GLUCOSE, CAPILLARY
Glucose-Capillary: 106 mg/dL — ABNORMAL HIGH (ref 70–99)
Glucose-Capillary: 438 mg/dL — ABNORMAL HIGH (ref 70–99)
Glucose-Capillary: 160 mg/dL — ABNORMAL HIGH (ref 70–99)
Glucose-Capillary: 97 mg/dL (ref 70–99)
Glucose-Capillary: 117 mg/dL — ABNORMAL HIGH (ref 70–99)

## 2018-03-15 LAB — MAGNESIUM: Magnesium: 2.4 mg/dL (ref 1.7–2.4)

## 2018-03-15 LAB — PHOSPHORUS: PHOSPHORUS: 4.2 mg/dL (ref 2.5–4.6)

## 2018-03-15 MED ORDER — FUROSEMIDE 40 MG PO TABS
40.0000 mg | ORAL_TABLET | Freq: Two times a day (BID) | ORAL | Status: DC
  Administered 2018-03-15 – 2018-03-16 (×3): 40 mg via ORAL
  Filled 2018-03-15 (×3): qty 1

## 2018-03-15 MED ORDER — IPRATROPIUM-ALBUTEROL 0.5-2.5 (3) MG/3ML IN SOLN
3.0000 mL | Freq: Three times a day (TID) | RESPIRATORY_TRACT | Status: DC
  Administered 2018-03-15 – 2018-03-16 (×2): 3 mL via RESPIRATORY_TRACT
  Filled 2018-03-15: qty 3

## 2018-03-15 MED ORDER — LIDOCAINE 5 % EX PTCH
1.0000 | MEDICATED_PATCH | CUTANEOUS | Status: DC
  Administered 2018-03-15 – 2018-03-16 (×2): 1 via TRANSDERMAL
  Filled 2018-03-15 (×2): qty 1

## 2018-03-15 NOTE — Progress Notes (Signed)
PROGRESS NOTE    Debbie Roberson  BLT:[id number] DOB: 05-01-1961 DOA: 03/13/2018 PCP: System, Pcp Not In   Brief Narrative:  Patient is a 57 year old obese African American female with past medical history significant for chronic systolic CHF, hyperlipidemia, diabetes mellitus type 2, GERD, HIV, tobacco abuse, CKD stage III, and other comorbidities who presents with a cough, shortness breath, chest pain, and fevers and chills.  She was found to be febrile in the ED and chest x-ray was done because of her shortness of breath and cough and revealed bilateral basilar infiltrate and cardiomegaly.  She was admitted to the stepdown unit because of her blood pressures being low.  Of note she was hospitalized from the end of June to the middle of July due to CHF exacerbation.    Patient was admitted for sepsis due to HCAP pneumonia as she has been recently in the hospital and she was admitted to stepdown unit and was placed on IV vancomycin and IV cefepime.  She was given IV azithromycin in the ED.  She was placed on albuterol nebs and given 1 L of fluid in the ED followed by normal saline at rate of 125 mL/hr which is now stopped.  Patient's blood pressure is improved so her home Delene Loll is resumed and Lasix was resumed today.  Further work-up revealed a positive respiratory virus panel and showed the patient has rhino/enterovirus.  Repeat chest x-ray showed no interval change in cardiomegaly, right-sided pleural effusion or opacity underlying the right pleural effusion.  She will continue on antibiotics however IV vancomycin was discontinued  Assessment & Plan:   Principal Problem:   HCAP (healthcare-associated pneumonia) Active Problems:   Chronic systolic CHF (congestive heart failure) (HCC)   HIV (human immunodeficiency virus infection) ([place])   Type II diabetes mellitus with renal manifestations (HCC)   GERD (gastroesophageal reflux disease)   Tobacco abuse   Sepsis ([place])   Acute renal failure  superimposed on stage 3 chronic kidney disease ([place])  Sepsis due to HCAP (healthcare-associated pneumonia) in the setting of Rhinovirus/Enteroviurs -[place] met criteria for sepsis with leukocytosis, tachypnea and fever.  Lactic acid is normal.   -Sepsis Physiology improving  -Blood pressures are soft, but currently mental status normal and hemodynamically stable.  Her low blood pressure is likely a chronic issue. -Admitted to SDU as inpt -IV Vancomycin and cefepime, azithromycin (patient received 1 dose of Rocephin in ED); IV Vancomycin discontined  -C/w Guaifenesin 1200 mg po BID, Flutter Valve and Incentive Spirometry  -PRN Albuterol Nebs, DuoNeb for SOB -Urine legionella and S. pneumococcal antigen being checked. Strep Pneumo Urinary Ag Negative - Follow up blood culture x2, sputum culture and respiratory virus panel; Respiratory Virus Panel Negative -Blood Cx x2 showed NGTD at 1 Day -Sputum Gram stain showed RARE WBC PRESENT, PREDOMINANTLY PMN, RARE GRAM POSITIVE COCCI, RARE GRAM NEGATIVE RODS, RARE GRAM POSITIVE RODS, RARE YEAST with Gram Stain pending  -Procalcitonin level was <0.10 -LA was 1.0; WBC improved and from 12.4 is now 8.6 -MRSA PCR was positive so we will continue with Bactroban nasal administration for 5 days -Given IVF: 1L of Ringer's solution in ED, followed by 125 mL per hour of NS (patient has EF 30%, limiting aggressive IV fluids treatment) and now stopped -Trop x 3 due to chest pain were <0.03 x3 -Repeat chest x-ray showed no interval change in cardiomegaly, right-sided pleural effusion, or possibly underlying the right pleural effusion -Continue with antibiotics as above andstop vancomycin and will de-escalate others as indicated  to clinical response -Patient has a cough associated pain on the left side of her ribs and will apply lidocaine patch  Chronic systolic CHF (congestive heart failure) ([place]):  -No 2D echo on record.   -Per patient report, patient has EF of  30%.   -Patient has trace leg edema, BNP 1165, slightly fluid overloaded, given sepsis held diuretics but ok to resume now that sepsis physiology improving  -Initially held Lasix and Entresto due to soft blood pressure but have now resumed both Entresto and Lasix 40 mg p.o. twice daily -Strict I's/O's, Daily Weights; Patient is +2.083 Liters -Weights are not done -Continue to Monitor Volume Status carefully   HIV (human immunodeficiency virus infection) ([place]):  -CD4 was 707 on 09/19/2016 -Continue home HIV medications with Biktarvy 50-2 100-25 mg p.o. daily  Type II diabetes mellitus with renal manifestations (HCC) -Last A1c 6.2 on 03/13/17, well controlled.  -Patient is taking Glipizide and metformin at home which will be held during hospitalization -Continue with sensitive NovoLog sliding scale AC and at bedtime -CBGs have been ranging from 106-438  GERD  -C/w Pantoprazole 40 mg po Daily   Tobacco Abuse -Smoking cessation counseling given -Continue nicotine patch 1 mg transdermally every 24  AoCKD-III, improving  -Baseline Cre is 0.8-1.0, pt's Cre is 1.22 and BUN 28 on admission. Likely due to dehydration and continuation of ACEI and diuretics -IVF as above now stopped -BUN/Cr now 30/1.15    -Initially held Entresto and Lasix but not both been resumed -Continue to monitor and trend renal function -Repeat CMP in a.m.  Hyperlipidemia -Continue with rosuvastatin 10 mg p.o. Daily  Obesity -Estimated body mass index is 34.06 kg/m as calculated from the following:   Height as of this encounter: 5' 4"  (1.626 m).   Weight as of this encounter: 90 kg.  -Weight loss counseling given  DVT prophylaxis: Enoxaparin 40 mg subcu every 24 Code Status: FULL CODE  Family Communication: No family present at bedside Disposition Plan: Transfer to the Medical Floor with Telemetry   Consultants:   None   Procedures: None  Antimicrobials:  Anti-infectives (From admission, onward)    Start     Dose/Rate Route Frequency Ordered Stop   03/15/18 1800  vancomycin (VANCOCIN) 1,250 mg in sodium chloride 0.9 % 250 mL IVPB  Status:  Discontinued     1,250 mg 166.7 mL/hr over 90 Minutes Intravenous Every 36 hours 03/14/18 0619 03/14/18 0834   03/14/18 1000  bictegravir-emtricitabine-tenofovir AF (BIKTARVY) 50-200-25 MG per tablet 1 tablet     1 tablet Oral Daily 03/14/18 0342     03/14/18 1000  vancomycin (VANCOCIN) IVPB 1000 mg/200 mL premix  Status:  Discontinued     1,000 mg 200 mL/hr over 60 Minutes Intravenous Every 24 hours 03/14/18 0834 03/15/18 0923   03/14/18 0600  ceFEPIme (MAXIPIME) 1 g in sodium chloride 0.9 % 100 mL IVPB     1 g 200 mL/hr over 30 Minutes Intravenous Every 8 hours 03/14/18 0347 03/22/18 0559   03/14/18 0400  vancomycin (VANCOCIN) 2,000 mg in sodium chloride 0.9 % 500 mL IVPB     2,000 mg 250 mL/hr over 120 Minutes Intravenous  Once 03/14/18 0354 03/14/18 0730   03/14/18 0000  cefTRIAXone (ROCEPHIN) 2 g in sodium chloride 0.9 % 100 mL IVPB  Status:  Discontinued     2 g 200 mL/hr over 30 Minutes Intravenous Every 24 hours 03/13/18 2353 03/14/18 0342   03/14/18 0000  azithromycin (ZITHROMAX) 500 mg in sodium  chloride 0.9 % 250 mL IVPB     500 mg 250 mL/hr over 60 Minutes Intravenous Every 24 hours 03/13/18 2353       Subjective: And examined at bedside states that she was feeling better than yesterday but still having significant amount of coughing and states that her sputum is productive.  States that she started having some rib pain from the coughing.  No chest pain, lightheadedness or dizziness.  No other concerns or complaints at this time and blood pressure is improved.  Objective: Vitals:   03/15/18 1000 03/15/18 1049 03/15/18 1100 03/15/18 1145  BP: 93/60 101/61 (!) 101/39   Pulse: 89  81   Resp: 20  (!) 23   Temp:    98.1 F (36.7 C)  TempSrc:    Oral  SpO2: 94%  95%   Weight:      Height:        Intake/Output Summary (Last 24  hours) at 03/15/2018 1602 Last data filed at 03/15/2018 0001 Gross per 24 hour  Intake 340 ml  Output 550 ml  Net -210 ml   Filed Weights   03/13/18 2352  Weight: 90 kg   Examination: Physical Exam:  Constitutional: WN/WD obese AAF in NAD and appears calm and comfortable Eyes: Lids and conjunctivae normal, sclerae anicteric  ENMT: External Ears, Nose appear normal. Grossly normal hearing. Mucous membranes are moist.  Neck: Appears normal, supple, no cervical masses, normal ROM, no appreciable thyromegaly; no JVD Respiratory: Diminished to auscultation bilaterally with mild crackles, no wheezing, rales, rhonchi or crackles. Normal respiratory effort and patient is not tachypenic. No accessory muscle use.  Cardiovascular: RRR, no murmurs / rubs / gallops. S1 and S2 auscultated. Trace-1+ LE Edema Abdomen: Soft, non-tender, Distended 2/2 body habitus. No masses palpated. No appreciable hepatosplenomegaly. Bowel sounds positive x4.  GU: Deferred. Musculoskeletal: No clubbing / cyanosis of digits/nails.  Normal strength and muscle tone.  Skin: No rashes, lesions, ulcers on a limited skin eval. No induration; Warm and dry.  Neurologic: CN 2-12 grossly intact with no focal deficits.  Romberg sign and cerebellar reflexes not assessed.  Psychiatric: Normal judgment and insight. Alert and oriented x 3. Normal mood and appropriate affect.   Data Reviewed: I have personally reviewed following labs and imaging studies  CBC: Recent Labs  Lab 03/14/18 0056 03/15/18 0324  WBC 12.4* 8.6  NEUTROABS 7.8* 5.6  HGB 12.2 10.9*  HCT 36.4 33.5*  MCV 88.1 89.1  PLT 218 449   Basic Metabolic Panel: Recent Labs  Lab 03/14/18 0056 03/15/18 0324  NA 137 138  K 4.2 4.4  CL 100 106  CO2 26 25  GLUCOSE 87 121*  BUN 28* 30*  CREATININE 1.22* 1.15*  CALCIUM 9.5 8.2*  MG  --  2.4  PHOS  --  4.2   GFR: Estimated Creatinine Clearance: 59.3 mL/min (A) (by C-G formula based on SCr of 1.15 mg/dL  (H)). Liver Function Tests: Recent Labs  Lab 03/14/18 0056 03/15/18 0324  AST 24 17  ALT 15 11  ALKPHOS 56 46  BILITOT 1.0 0.5  PROT 9.4* 7.4  ALBUMIN 3.9 3.0*   No results for input(s): LIPASE, AMYLASE in the last 168 hours. No results for input(s): AMMONIA in the last 168 hours. Coagulation Profile: No results for input(s): INR, PROTIME in the last 168 hours. Cardiac Enzymes: Recent Labs  Lab 03/14/18 1036 03/14/18 1630 03/14/18 2247  TROPONINI <0.03 <0.03 <0.03   BNP (last 3 results) No results for  input(s): PROBNP in the last 8760 hours. HbA1C: No results for input(s): HGBA1C in the last 72 hours. CBG: Recent Labs  Lab 03/14/18 1138 03/14/18 1552 03/15/18 0732 03/15/18 1126 03/15/18 1131  GLUCAP 149* 105* 106* 438* 160*   Lipid Profile: No results for input(s): CHOL, HDL, LDLCALC, TRIG, CHOLHDL, LDLDIRECT in the last 72 hours. Thyroid Function Tests: No results for input(s): TSH, T4TOTAL, FREET4, T3FREE, THYROIDAB in the last 72 hours. Anemia Panel: No results for input(s): VITAMINB12, FOLATE, FERRITIN, TIBC, IRON, RETICCTPCT in the last 72 hours. Sepsis Labs: Recent Labs  Lab 03/14/18 0111 03/14/18 1036  PROCALCITON  --  <0.10  LATICACIDVEN 1.22 1.0    Recent Results (from the past 240 hour(s))  Blood Culture (routine x 2)     Status: None (Preliminary result)   Collection Time: 03/14/18  1:02 AM  Result Value Ref Range Status   Specimen Description   Final    BLOOD LEFT HAND Performed at [facility] [address] [place], [place] [address]    Special Requests   Final    BOTTLES DRAWN AEROBIC AND ANAEROBIC Blood Culture adequate volume Performed at [facility] [address], [place], [place] [address]    Culture   Final    NO GROWTH 1 DAY Performed at [facility], [place] [address], [place], [place] [address]    Report Status PENDING  Incomplete  Blood Culture (routine x 2)     Status: None  (Preliminary result)   Collection Time: 03/14/18  1:02 AM  Result Value Ref Range Status   Specimen Description   Final    BLOOD RIGHT HAND Performed at [facility] [address] [place], [place] [address]    Special Requests   Final    BOTTLES DRAWN AEROBIC AND ANAEROBIC Blood Culture adequate volume Performed at [facility] [address] [place], [place] [address]    Culture   Final    NO GROWTH 1 DAY Performed at [facility], [place] [address], [place], [place] [address]    Report Status PENDING  Incomplete  MRSA PCR Screening     Status: Abnormal   Collection Time: 03/14/18  6:47 AM  Result Value Ref Range Status   MRSA by PCR POSITIVE (A) NEGATIVE Final    Comment:        The GeneXpert MRSA Assay (FDA approved for NASAL specimens only), is one component of a comprehensive MRSA colonization surveillance program. It is not intended to diagnose MRSA infection nor to guide or monitor treatment for MRSA infections. CRITICAL RESULT CALLED TO, READ BACK BY AND VERIFIED WITH: MYRICK,R RN [place] Performed at [facility] [address], [place], [place] [address]   Respiratory Panel by PCR     Status: Abnormal   Collection Time: 03/14/18  6:48 AM  Result Value Ref Range Status   Adenovirus NOT DETECTED NOT DETECTED Final   Coronavirus 229E NOT DETECTED NOT DETECTED Final   Coronavirus HKU1 NOT DETECTED NOT DETECTED Final   Coronavirus NL63 NOT DETECTED NOT DETECTED Final   Coronavirus OC43 NOT DETECTED NOT DETECTED Final   Metapneumovirus NOT DETECTED NOT DETECTED Final   Rhinovirus / Enterovirus DETECTED (A) NOT DETECTED Final   Influenza A NOT DETECTED NOT DETECTED Final   Influenza B NOT DETECTED NOT DETECTED Final   Parainfluenza Virus 1 NOT DETECTED NOT DETECTED Final   Parainfluenza Virus 2 NOT DETECTED  NOT DETECTED Final   Parainfluenza Virus 3 NOT DETECTED NOT DETECTED Final    Parainfluenza Virus 4 NOT DETECTED NOT DETECTED Final   Respiratory Syncytial Virus NOT DETECTED NOT DETECTED Final   Bordetella pertussis NOT DETECTED NOT DETECTED Final   Chlamydophila pneumoniae NOT DETECTED NOT DETECTED Final   Mycoplasma pneumoniae NOT DETECTED NOT DETECTED Final    Comment: Performed at [facility], [place] [address] [place], [place] [address]  Culture, sputum-assessment     Status: None   Collection Time: 03/14/18  7:52 AM  Result Value Ref Range Status   Specimen Description SPUTUM  Final   Special Requests NONE  Final   Sputum evaluation   Final    THIS SPECIMEN IS ACCEPTABLE FOR SPUTUM CULTURE Performed at [facility], [place] [address], [place], [place] [address]    Report Status 03/14/2018 FINAL  Final  Culture, respiratory     Status: None (Preliminary result)   Collection Time: 03/14/18  7:52 AM  Result Value Ref Range Status   Specimen Description   Final    SPUTUM Performed at [facility] [address], [place], [place] [address]    Special Requests   Final    NONE Reflexed from [id number] Performed at [facility], [place] [address], [place], [place] [address]    Gram Stain   Final    RARE WBC PRESENT, PREDOMINANTLY PMN RARE GRAM POSITIVE COCCI RARE GRAM NEGATIVE RODS RARE GRAM POSITIVE RODS RARE YEAST    Culture   Final    CULTURE REINCUBATED FOR BETTER GROWTH Performed at [facility], [place] [address] [place], [place] [address]    Report Status PENDING  Incomplete    Radiology Studies: Dg Chest 2 View  Result Date: 03/13/2018 CLINICAL DATA:  57 year old female with chest pain and shortness of breath. EXAM: CHEST - 2 VIEW COMPARISON:  None. FINDINGS: Probable small bilateral pleural effusions and associated atelectatic changes of the lung bases versus infiltrate. No pneumothorax. There is cardiomegaly. No acute osseous pathology. IMPRESSION: 1. Small bilateral pleural effusions  and bibasilar atelectasis versus infiltrate. 2. Cardiomegaly. Electronically Signed   By: Anner Crete M.D.   On: 03/13/2018 23:44   Dg Chest Port 1 View  Result Date: 03/15/2018 CLINICAL DATA:  Shortness of breath. EXAM: PORTABLE CHEST 1 VIEW COMPARISON:  March 13, 2018 FINDINGS: Cardiomegaly. Small right effusion with underlying opacity, stable. No other interval changes. IMPRESSION: No interval change in cardiomegaly, the right-sided pleural effusion, or the opacity underlying the right pleural effusion. Electronically Signed   By: Dorise Bullion III M.D   On: 03/15/2018 14:22   Scheduled Meds: . aspirin EC  81 mg Oral Daily  . bictegravir-emtricitabine-tenofovir AF  1 tablet Oral Daily  . Chlorhexidine Gluconate Cloth  6 each Topical Q0600  . enoxaparin (LOVENOX) injection  40 mg Subcutaneous Q24H  . furosemide  40 mg Oral BID  . guaiFENesin  1,200 mg Oral BID  . Influenza vac split quadrivalent PF  0.5 mL Intramuscular Tomorrow-1000  . insulin aspart  0-5 Units Subcutaneous QHS  . insulin aspart  0-9 Units Subcutaneous TID WC  . ipratropium-albuterol  3 mL Nebulization Q6H  . lidocaine  1 patch Transdermal Q24H  . metoprolol succinate  12.5 mg Oral Daily  . mupirocin ointment  1 application Nasal BID  . nicotine  21 mg Transdermal Daily  . pantoprazole  40 mg Oral Daily  . rosuvastatin  10 mg  Oral Daily  . sacubitril-valsartan  1 tablet Oral Q12H   Continuous Infusions: . azithromycin Stopped (03/15/18 0204)  . ceFEPime (MAXIPIME) IV Stopped (03/15/18 1500)    LOS: 1 day   Kerney Elbe, DO [facility] PAGER is on [place]  If 7PM-7AM, please contact night-coverage [email or web address] Password [email or web address] 03/15/2018, 4:02 PM

## 2018-03-16 DIAGNOSIS — N179 Acute kidney failure, unspecified: Secondary | ICD-10-CM | POA: Diagnosis not present

## 2018-03-16 DIAGNOSIS — A419 Sepsis, unspecified organism: Secondary | ICD-10-CM | POA: Diagnosis not present

## 2018-03-16 DIAGNOSIS — J189 Pneumonia, unspecified organism: Secondary | ICD-10-CM | POA: Diagnosis not present

## 2018-03-16 DIAGNOSIS — I5022 Chronic systolic (congestive) heart failure: Secondary | ICD-10-CM | POA: Diagnosis not present

## 2018-03-16 LAB — CBC WITH DIFFERENTIAL/PLATELET
BASOS PCT: 0 %
Basophils Absolute: 0 10*3/uL (ref 0.0–0.1)
Eosinophils Absolute: 0 10*3/uL (ref 0.0–0.7)
Eosinophils Relative: 0 %
HEMATOCRIT: 39.8 % (ref 36.0–46.0)
Hemoglobin: 12.8 g/dL (ref 12.0–15.0)
LYMPHS ABS: 2.3 10*3/uL (ref 0.7–4.0)
LYMPHS PCT: 25 %
MCH: 28.8 pg (ref 26.0–34.0)
MCHC: 32.2 g/dL (ref 30.0–36.0)
MCV: 89.4 fL (ref 78.0–100.0)
MONO ABS: 0.6 10*3/uL (ref 0.1–1.0)
Monocytes Relative: 6 %
Neutro Abs: 6.3 10*3/uL (ref 1.7–7.7)
Neutrophils Relative %: 69 %
PLATELETS: 260 10*3/uL (ref 150–400)
RBC: 4.45 MIL/uL (ref 3.87–5.11)
RDW: 14.9 % (ref 11.5–15.5)
WBC: 9.2 10*3/uL (ref 4.0–10.5)

## 2018-03-16 LAB — COMPREHENSIVE METABOLIC PANEL
ALBUMIN: 3.1 g/dL — AB (ref 3.5–5.0)
ALK PHOS: 52 U/L (ref 38–126)
ALT: 11 U/L (ref 0–44)
ANION GAP: 7 (ref 5–15)
AST: 19 U/L (ref 15–41)
BILIRUBIN TOTAL: 0.6 mg/dL (ref 0.3–1.2)
BUN: 27 mg/dL — AB (ref 6–20)
CALCIUM: 9.2 mg/dL (ref 8.9–10.3)
CO2: 25 mmol/L (ref 22–32)
Chloride: 110 mmol/L (ref 98–111)
Creatinine, Ser: 1.22 mg/dL — ABNORMAL HIGH (ref 0.44–1.00)
GFR calc Af Amer: 56 mL/min — ABNORMAL LOW (ref >60)
GFR calc non Af Amer: 49 mL/min — ABNORMAL LOW (ref >60)
GLUCOSE: 107 mg/dL — AB (ref 70–99)
Potassium: 5 mmol/L (ref 3.5–5.1)
SODIUM: 142 mmol/L (ref 135–145)
Total Protein: 7.8 g/dL (ref 6.5–8.1)

## 2018-03-16 LAB — MAGNESIUM: Magnesium: 2.2 mg/dL (ref 1.7–2.4)

## 2018-03-16 LAB — LEGIONELLA PNEUMOPHILA SEROGP 1 UR AG: L. PNEUMOPHILA SEROGP 1 UR AG: NEGATIVE

## 2018-03-16 LAB — GLUCOSE, CAPILLARY
GLUCOSE-CAPILLARY: 109 mg/dL — AB (ref 70–99)
GLUCOSE-CAPILLARY: 117 mg/dL — AB (ref 70–99)
Glucose-Capillary: 159 mg/dL — ABNORMAL HIGH (ref 70–99)

## 2018-03-16 LAB — PHOSPHORUS: Phosphorus: 4.2 mg/dL (ref 2.5–4.6)

## 2018-03-16 MED ORDER — HYDROXYZINE HCL 10 MG PO TABS: 10.0000 mg | ORAL_TABLET | Freq: Three times a day (TID) | ORAL | 0 refills | Status: AC | PRN

## 2018-03-16 MED ORDER — LIDOCAINE 5 % EX PTCH: 1.0000 | MEDICATED_PATCH | CUTANEOUS | 0 refills | Status: AC

## 2018-03-16 MED ORDER — LEVOFLOXACIN IN D5W 750 MG/150ML IV SOLN
750.0000 mg | Freq: Once | INTRAVENOUS | Status: AC
  Administered 2018-03-16: 750 mg via INTRAVENOUS
  Filled 2018-03-16: qty 150

## 2018-03-16 MED ORDER — IPRATROPIUM-ALBUTEROL 0.5-2.5 (3) MG/3ML IN SOLN: 3.0000 mL | Freq: Four times a day (QID) | RESPIRATORY_TRACT | 0 refills | Status: AC | PRN

## 2018-03-16 MED ORDER — ALBUTEROL SULFATE HFA 108 (90 BASE) MCG/ACT IN AERS: 2.0000 | INHALATION_SPRAY | Freq: Four times a day (QID) | RESPIRATORY_TRACT | 2 refills | Status: AC | PRN

## 2018-03-16 MED ORDER — NICOTINE 21 MG/24HR TD PT24: 21.0000 mg | MEDICATED_PATCH | Freq: Every day | TRANSDERMAL | 0 refills | Status: AC

## 2018-03-16 MED ORDER — AZITHROMYCIN 250 MG PO TABS: 500.0000 mg | ORAL_TABLET | Freq: Every day | ORAL | 0 refills | Status: AC

## 2018-03-16 MED ORDER — CEFPODOXIME PROXETIL 200 MG PO TABS: 200.0000 mg | ORAL_TABLET | Freq: Two times a day (BID) | ORAL | Status: DC

## 2018-03-16 MED ORDER — LEVOFLOXACIN 750 MG PO TABS: 750.0000 mg | ORAL_TABLET | Freq: Every day | ORAL | 0 refills | Status: DC

## 2018-03-16 MED ORDER — CEFPODOXIME PROXETIL 200 MG PO TABS: 200.0000 mg | ORAL_TABLET | Freq: Two times a day (BID) | ORAL | 0 refills | Status: AC

## 2018-03-16 MED ORDER — ACETAMINOPHEN 325 MG PO TABS: 650.0000 mg | ORAL_TABLET | Freq: Four times a day (QID) | ORAL | 0 refills | Status: AC | PRN

## 2018-03-16 MED ORDER — LEVOFLOXACIN 750 MG PO TABS: 750.0000 mg | ORAL_TABLET | Freq: Every day | ORAL | Status: DC

## 2018-03-16 MED ORDER — GUAIFENESIN ER 600 MG PO TB12: 600.0000 mg | ORAL_TABLET | Freq: Two times a day (BID) | ORAL | 0 refills | Status: AC

## 2018-03-16 MED ORDER — AZITHROMYCIN 250 MG PO TABS: 500.0000 mg | ORAL_TABLET | Freq: Every day | ORAL | Status: DC

## 2018-03-16 NOTE — Progress Notes (Signed)
Pt to be discharged to home this afternoon. Discharge Instructions along with Medications new medications and resuming Medications reviewed with Pt. Pt verbalized understanding of all discharge teaching. Discharge packet with pt at time of discharge

## 2018-03-16 NOTE — Progress Notes (Signed)
SATURATION QUALIFICATIONS: (This note is used to comply with regulatory documentation for home oxygen)  Patient Saturations on Room Air at Rest = 99%  Patient Saturations on Room Air while Ambulating =95%   

## 2018-03-16 NOTE — Care Management Note (Signed)
Case Management Note  Patient Details  Name: Reve Crocket MRN: [id number] Date of Birth: 12-18-1960  Subjective/Objective: No CM needs.                   Action/Plan:dC home.   Expected Discharge Date:  03/16/18               Expected Discharge Plan:  Home/Self Care  In-House Referral:     Discharge planning Services     Post Acute Care Choice:    Choice offered to:     DME Arranged:    DME Agency:     HH Arranged:    HH Agency:     Status of Service:  Completed, signed off  If discussed at [employer] of Stay Meetings, dates discussed:    Additional Comments:  Lanier Clam, RN 03/16/2018, 3:07 PM

## 2018-03-16 NOTE — Discharge Summary (Addendum)
Physician Discharge Summary  Debbie Roberson SNK:[id number] DOB: 1960-11-06 DOA: 03/13/2018  PCP: System, Pcp Not In  Admit date: 03/13/2018 Discharge date: 03/16/2018  Admitted From: Home Disposition: Home  Recommendations for Outpatient Follow-up:  1. Follow up with PCP in 1-2 weeks 2. Follow up with ID as an outpatient 3. Repeat CXR in 3-6 Weeks 4. Please obtain CMP/CBC, Mag, Phos in one week 5. Please follow up on the following pending results: Sputum Culture results and Urine Legionella Antigen  Home Health: No Equipment/Devices: None   Discharge Condition: Stable CODE STATUS: FULL CODE  Diet recommendation: Low Sodium Heart Healthy Carb Modified Diet    Brief/Interim Summary: Patient is a 57 year old obese African American female with past medical history significant for chronic systolic CHF, hyperlipidemia, diabetes mellitus type 2, GERD, HIV, tobacco abuse, CKD stage III, and other comorbidities who presents with a cough, shortness breath, chest pain, and fevers and chills. She was found to be febrile in the ED and chest x-ray was done because of her shortness of breath and cough and revealed bilateral basilar infiltrate and cardiomegaly. She was admitted to the stepdown unit because of her blood pressures being low. Of note she was hospitalized from the end of June to the middle of July due to CHF exacerbation.   Patient was admitted for sepsis due to HCAP pneumonia as she has been recently in the hospital and she was admitted to stepdown unit and was placed on IV vancomycin and IV cefepime. She was given IV azithromycin in the ED. She was placed on albuterol nebs and given 1 L of fluid in the ED followed by normal saline at rate of 125 mL/hrwhich is now stopped.Patient's blood pressure is improved so her home Delene Loll is resumed and Lasix was resumed today.  Further work-up revealed a positive respiratory virus panel and showed the patient has rhino/enterovirus.  Repeat  chest x-ray showed no interval change in cardiomegaly, right-sided pleural effusion or opacity underlying the right pleural effusion. She will continue on antibiotics however IV vancomycin was discontinued and IV cefepime was changed to IV Levaquin.  She will be discharged on p.o. Vantin/Azithromycin for total 7 days total of Abx coverage as she refused Levofloxacin at D/C.  Patient was deemed medically stable to be discharged at this time she need to follow-up with primary care physician as well as Cardiology and Infectious Diseases in outpatient setting.  Discharge Diagnoses:  Principal Problem:   HCAP (healthcare-associated pneumonia) Active Problems:   Chronic systolic CHF (congestive heart failure) (HCC)   HIV (human immunodeficiency virus infection) ([place])   Type II diabetes mellitus with renal manifestations ([place])   GERD (gastroesophageal reflux disease)   Tobacco abuse   Sepsis ([place])   Acute renal failure superimposed on stage 3 chronic kidney disease ([place])  Sepsis due toHCAP (healthcare-associated pneumonia) in the setting of Rhinovirus/Enteroviurs, improving  -Patiet met criteria for sepsis with leukocytosis, tachypnea and fever. Lactic acid is normal.  -Sepsis Physiology improved -Blood pressures are soft, but currently mental status normal and hemodynamically stable. Her low blood pressure is likely a chronic issue per patient systolics run from 93'X-90'W -Admitted toSDUas inpt transfer to telemetry -IV Vancomycin and cefepime, azithromycin(patient received 1 dose of Rocephin in ED); IV Vancomycin discontined  and IV cefepime is no change to IV Levaquin and will change to p.o. Levaquin at discharge but patient refused so will change to po Vantin/Azithromycin -C/w Guaifenesin 1200 mg po BID, Flutter Valve and Incentive Spirometry  -PRN Albuterol Nebs, DuoNeb  for SOB -Urine legionella and S. pneumococcal antigen being checked. Strep Pneumo Urinary Ag Negative - Follow up blood  culture x2, sputum culture and respiratory virus panel; Respiratory Virus Panel Positive for Rhino/Enterovirus  -Blood Cx x2 showed NGTD at 1 Day still -Sputum Gram stain showed RARE WBC PRESENT, PREDOMINANTLY PMN, RARE GRAM POSITIVE COCCI, RARE GRAM NEGATIVE RODS, RARE GRAM POSITIVE RODS, RARE YEAST with Gram Stain pending -Procalcitonin level was <0.10 -LA was 1.0; WBC improved and from 12.4 is now 9.2 -MRSA PCR was positive so we will continue with Bactroban nasal administration for 5 days -Given IVF:1L ofRinger's solutionin ED, followed by 125 mL per hour of NS (patient hasEF 30%,limiting aggressive IV fluids treatment) and now stopped -Trop x 3 due to chest pain were <0.03 x3 -Repeat chest x-ray showed no interval change in cardiomegaly, right-sided pleural effusion, or possibly underlying the right pleural effusion -Continue with antibiotics as above and stop vancomycin and will de-escalate others as indicated to clinical response -Patient has a cough associated pain on the left side of her ribs and will apply lidocaine patch -Patient improved clinically and will do amatory screen prior to discharge -Continue with Vantin with Azithromycin p.o. at discharge for completion of 7-day course of Abx -Home O2 Screen Done showed that patient did not desaturate -Follow up with PCP as an outpatient and repeat CXR in 3-6 weeks  Chronic systolic CHF (congestive heart failure) ([place]): -No 2D echo on record.  -Per patient report, patient has EF of 30%.  -Patient has trace leg edema, BNP 1165, slightly fluid overloaded, given sepsis held diuretics but ok to resume now that sepsis physiology improving  -Initially held Lasix and Entresto due to soft blood pressure but have now resumed both Entresto and Lasix 40 mg p.o. twice daily -Strict I's/O's, Daily Weights; Patient is +3.348 Liters -Weights are unlikely to be accurate as Weight weng up 26 lbs -Continue to Monitor Volume Status carefully   appears euvolemic -Follow-up with Cardiology in outpatient setting  HIV (human immunodeficiency virus infection) ([place]): -CD4 was 707on 09/19/2016 -Continue home HIV medications with Biktarvy 50-2 100-25 mg p.o. daily  Type II diabetes mellitus with renal manifestations ([place]) -Last A1c6.2 on 03/13/17, well controlled.  -Patient is takingGlipizide and metforminat home which will be held during hospitalization -Continue with sensitive NovoLog sliding scale AC and at bedtime -CBGs have been ranging from 97-117  GERD  -C/w Pantoprazole 40 mg po Daily   Tobacco Abuse -Smoking cessation counseling given -Continue Nicotine patch 1 mg transdermally every 24  AoCKD-III, improving  -Baseline Cre is0.8-1.0, pt's Cre is1.22 and BUN 28on admission. Likely due to dehydration and continuation of ACEI anddiuretics -IVF as above now stopped -BUN/Cr now 27/1.22 -Initially heldEntresto and Lasix but not both been resumed -Continue to monitor and trend renal function as an outpatient  -Repeat CMP at PCP office  Hyperlipidemia -Continue with Rosuvastatin 10 mg p.o. Daily  Obesity -Estimated body mass index is 34.06 kg/m as calculated from the following:   Height as of this encounter: 5' 4" (1.626 m).   Weight as of this encounter: 90 kg.  -Weight loss counseling given  Discharge Instructions Discharge Instructions    Call MD for:  difficulty breathing, headache or visual disturbances   Complete by:  As directed    Call MD for:  extreme fatigue   Complete by:  As directed    Call MD for:  hives   Complete by:  As directed    Call MD for:  persistant dizziness or light-headedness   Complete by:  As directed    Call MD for:  persistant nausea and vomiting   Complete by:  As directed    Call MD for:  redness, tenderness, or signs of infection (pain, swelling, redness, odor or green/yellow discharge around incision site)   Complete by:  As directed    Call MD for:  severe  uncontrolled pain   Complete by:  As directed    Call MD for:  temperature >100.4   Complete by:  As directed    DME Nebulizer/meds   Complete by:  As directed    Patient needs a nebulizer to treat with the following condition:  SOB (shortness of breath)   Diet - low sodium heart healthy   Complete by:  As directed    Diet Carb Modified   Complete by:  As directed    Discharge instructions   Complete by:  As directed    You were cared for by a hospitalist during your hospital stay. If you have any questions about your discharge medications or the care you received while you were in the hospital after you are discharged, you can call the unit and ask to speak with the hospitalist on call if the hospitalist that took care of you is not available. Once you are discharged, your primary care physician will handle any further medical issues. Please note that NO REFILLS for any discharge medications will be authorized once you are discharged, as it is imperative that you return to your primary care physician (or establish a relationship with a primary care physician if you do not have one) for your aftercare needs so that they can reassess your need for medications and monitor your lab values.  Follow up with PCP, Cardiology, and Infectious Diseases. Take all medications as prescribed. If symptoms change or worsen please return to the ED for evaluation   Increase activity slowly   Complete by:  As directed      Allergies as of 03/16/2018   No Known Allergies     Medication List    TAKE these medications   acetaminophen 325 MG tablet Commonly known as:  TYLENOL Take 2 tablets (650 mg total) by mouth every 6 (six) hours as needed for mild pain or fever.   albuterol 108 (90 Base) MCG/ACT inhaler Commonly known as:  PROVENTIL HFA;VENTOLIN HFA Inhale 2 puffs into the lungs every 6 (six) hours as needed for wheezing or shortness of breath.   aspirin EC 81 MG tablet Take 81 mg by mouth daily.    bictegravir-emtricitabine-tenofovir AF 50-200-25 MG Tabs tablet Commonly known as:  BIKTARVY Take 1 tablet by mouth daily.   ENTRESTO 24-26 MG Generic drug:  sacubitril-valsartan Take 1 tablet by mouth every 12 (twelve) hours.   furosemide 40 MG tablet Commonly known as:  LASIX Take 40 mg by mouth 2 (two) times daily.   glipiZIDE 2.5 MG 24 hr tablet Commonly known as:  GLUCOTROL XL Take 2.5 mg by mouth daily with breakfast.   guaiFENesin 600 MG 12 hr tablet Commonly known as:  MUCINEX Take 1 tablet (600 mg total) by mouth 2 (two) times daily for 3 days.   hydrOXYzine 10 MG tablet Commonly known as:  ATARAX/VISTARIL Take 1 tablet (10 mg total) by mouth 3 (three) times daily as needed for nausea.   ipratropium-albuterol 0.5-2.5 (3) MG/3ML Soln Commonly known as:  DUONEB Take 3 mLs by nebulization every 6 (six) hours as needed.  levofloxacin 750 MG tablet Commonly known as:  LEVAQUIN Take 1 tablet (750 mg total) by mouth daily for 3 days. Start taking on:  03/17/2018   lidocaine 5 % Commonly known as:  LIDODERM Place 1 patch onto the skin daily. Remove & Discard patch within 12 hours or as directed by MD Start taking on:  03/17/2018   metFORMIN 500 MG tablet Commonly known as:  GLUCOPHAGE Take 500 mg by mouth 2 (two) times daily with a meal.   metoprolol succinate 25 MG 24 hr tablet Commonly known as:  TOPROL-XL Take 12.5 mg by mouth daily.   nicotine 21 mg/24hr patch Commonly known as:  NICODERM CQ - dosed in mg/24 hours Place 1 patch (21 mg total) onto the skin daily. Start taking on:  03/17/2018   omeprazole 40 MG capsule Commonly known as:  PRILOSEC Take 40 mg by mouth 2 (two) times daily.   rosuvastatin 10 MG tablet Commonly known as:  CRESTOR Take 10 mg by mouth daily.            Durable Medical Equipment  (From admission, onward)         Start     Ordered   03/16/18 0000  DME Nebulizer/meds    Question:  Patient needs a nebulizer to treat  with the following condition  Answer:  SOB (shortness of breath)   03/16/18 1336          No Known Allergies  Consultations:  None  Procedures/Studies: Dg Chest 2 View  Result Date: 03/13/2018 CLINICAL DATA:  57 year old female with chest pain and shortness of breath. EXAM: CHEST - 2 VIEW COMPARISON:  None. FINDINGS: Probable small bilateral pleural effusions and associated atelectatic changes of the lung bases versus infiltrate. No pneumothorax. There is cardiomegaly. No acute osseous pathology. IMPRESSION: 1. Small bilateral pleural effusions and bibasilar atelectasis versus infiltrate. 2. Cardiomegaly. Electronically Signed   By: Anner Crete M.D.   On: 03/13/2018 23:44   Dg Chest Port 1 View  Result Date: 03/15/2018 CLINICAL DATA:  Shortness of breath. EXAM: PORTABLE CHEST 1 VIEW COMPARISON:  March 13, 2018 FINDINGS: Cardiomegaly. Small right effusion with underlying opacity, stable. No other interval changes. IMPRESSION: No interval change in cardiomegaly, the right-sided pleural effusion, or the opacity underlying the right pleural effusion. Electronically Signed   By: Dorise Bullion III M.D   On: 03/15/2018 14:22    Subjective: And examined at bedside and states she was doing well.  Denied any chest pain, lightheadedness or dizziness.  No nausea, vomiting.  States she is coughing up sputum.  Feels better than coming in and denies any other complaints or concerns and is ready to go home.  Discharge Exam: Vitals:   03/16/18 0749 03/16/18 1035  BP:  90/66  Pulse:  88  Resp:    Temp:    SpO2: 93% 94%   Vitals:   03/16/18 0505 03/16/18 0727 03/16/18 0749 03/16/18 1035  BP: 106/62   90/66  Pulse: 82   88  Resp: 17     Temp: 98.8 F (37.1 C)     TempSrc: Oral     SpO2: 96%  93% 94%  Weight:  101.9 kg    Height:       General: Pt is an obese AAF alert, awake, not in acute distress Cardiovascular: RRR, S1/S2 +, no rubs, no gallops Respiratory: Diminished  bilaterally, no wheezing, no rhonchi Abdominal: Soft, NT, Distended due to body habitus, bowel sounds + Extremities: Trace edema,  no cyanosis  The results of significant diagnostics from this hospitalization (including imaging, microbiology, ancillary and laboratory) are listed below for reference.    Microbiology: Recent Results (from the past 240 hour(s))  Blood Culture (routine x 2)     Status: None (Preliminary result)   Collection Time: 03/14/18  1:02 AM  Result Value Ref Range Status   Specimen Description   Final    BLOOD LEFT HAND Performed at [facility] [address], [place], [place] [address]    Special Requests   Final    BOTTLES DRAWN AEROBIC AND ANAEROBIC Blood Culture adequate volume Performed at [facility] [address], [place], [place] [address]    Culture   Final    NO GROWTH 1 DAY Performed at [facility], [place] [address] [place], [place] [address]    Report Status PENDING  Incomplete  Blood Culture (routine x 2)     Status: None (Preliminary result)   Collection Time: 03/14/18  1:02 AM  Result Value Ref Range Status   Specimen Description   Final    BLOOD RIGHT HAND Performed at [facility] [address] [place], [place] [address]    Special Requests   Final    BOTTLES DRAWN AEROBIC AND ANAEROBIC Blood Culture adequate volume Performed at [facility] [address] [place], [place] [address]    Culture   Final    NO GROWTH 1 DAY Performed at [facility], [place] [address] [place], [place] [address]    Report Status PENDING  Incomplete  MRSA PCR Screening     Status: Abnormal   Collection Time: 03/14/18  6:47 AM  Result Value Ref Range Status   MRSA by PCR POSITIVE (A) NEGATIVE Final    Comment:        The GeneXpert MRSA Assay (FDA approved for NASAL specimens only), is one component of a comprehensive MRSA colonization surveillance program. It is  not intended to diagnose MRSA infection nor to guide or monitor treatment for MRSA infections. CRITICAL RESULT CALLED TO, READ BACK BY AND VERIFIED WITH: MYRICK,R RN [place] Performed at [facility] [address] [place], [place] [address]   Respiratory Panel by PCR     Status: Abnormal   Collection Time: 03/14/18  6:48 AM  Result Value Ref Range Status   Adenovirus NOT DETECTED NOT DETECTED Final   Coronavirus 229E NOT DETECTED NOT DETECTED Final   Coronavirus HKU1 NOT DETECTED NOT DETECTED Final   Coronavirus NL63 NOT DETECTED NOT DETECTED Final   Coronavirus OC43 NOT DETECTED NOT DETECTED Final   Metapneumovirus NOT DETECTED NOT DETECTED Final   Rhinovirus / Enterovirus DETECTED (A) NOT DETECTED Final   Influenza A NOT DETECTED NOT DETECTED Final   Influenza B NOT DETECTED NOT DETECTED Final   Parainfluenza Virus 1 NOT DETECTED NOT DETECTED Final   Parainfluenza Virus 2 NOT DETECTED NOT DETECTED Final   Parainfluenza Virus 3 NOT DETECTED NOT DETECTED Final   Parainfluenza Virus 4 NOT DETECTED NOT DETECTED Final   Respiratory Syncytial Virus NOT DETECTED NOT DETECTED Final   Bordetella pertussis NOT DETECTED NOT DETECTED Final   Chlamydophila pneumoniae NOT DETECTED NOT DETECTED Final   Mycoplasma pneumoniae NOT DETECTED NOT DETECTED Final    Comment: Performed at [facility], [address], [place], [place] [address]  Culture, sputum-assessment     Status: None   Collection Time:  03/14/18  7:52 AM  Result Value Ref Range Status   Specimen Description SPUTUM  Final   Special Requests NONE  Final   Sputum evaluation   Final    THIS SPECIMEN IS ACCEPTABLE FOR SPUTUM CULTURE Performed at [facility], [place] [address] [place], [place] [address]    Report Status 03/14/2018 FINAL  Final  Culture, respiratory     Status: None (Preliminary result)   Collection Time: 03/14/18  7:52 AM  Result Value Ref Range Status    Specimen Description   Final    SPUTUM Performed at [facility] [address] [place], [place] [address]    Special Requests   Final    NONE Reflexed from [id number] Performed at [facility], [place] [address], [place], [place] [address]    Gram Stain   Final    RARE WBC PRESENT, PREDOMINANTLY PMN RARE GRAM POSITIVE COCCI RARE GRAM NEGATIVE RODS RARE GRAM POSITIVE RODS RARE YEAST    Culture   Final    CULTURE REINCUBATED FOR BETTER GROWTH Performed at [facility], [place] [address] [place], [place] [address]    Report Status PENDING  Incomplete    Labs: BNP (last 3 results) Recent Labs    03/14/18 0056  BNP 1,448.1*   Basic Metabolic Panel: Recent Labs  Lab 03/14/18 0056 03/15/18 0324 03/16/18 0809  NA 137 138 142  K 4.2 4.4 5.0  CL 100 106 110  CO2 _0 GLUCOSE 87 121* 107*  BUN 28* 30* 27*  CREATININE 1.22* 1.15* 1.22*  CALCIUM 9.5 8.2* 9.2  MG  --  2.4 2.2  PHOS  --  4.2 4.2   Liver Function Tests: Recent Labs  Lab 03/14/18 0056 03/15/18 0324 03/16/18 0809  AST _1 ALT _2 ALKPHOS 56 46 52  BILITOT 1.0 0.5 0.6  PROT 9.4* 7.4 7.8  ALBUMIN 3.9 3.0* 3.1*   No results for input(s): LIPASE, AMYLASE in the last 168 hours. No results for input(s): AMMONIA in the last 168 hours. CBC: Recent Labs  Lab 03/14/18 0056 03/15/18 0324 03/16/18 0842  WBC 12.4* 8.6 9.2  NEUTROABS 7.8* 5.6 6.3  HGB 12.2 10.9* 12.8  HCT 36.4 33.5* 39.8  MCV 88.1 89.1 89.4  PLT 218 192 260   Cardiac Enzymes: Recent Labs  Lab 03/14/18 1036 03/14/18 1630 03/14/18 2247  TROPONINI <0.03 <0.03 <0.03   BNP: Invalid input(s): POCBNP CBG: Recent Labs  Lab 03/15/18 1131 03/15/18 1706 03/15/18 2036 03/16/18 0724 03/16/18 1224  GLUCAP 160* 97 117* 109* 117*   D-Dimer No results for input(s): DDIMER in the last 72 hours. Hgb A1c No results for input(s): HGBA1C in the last 72 hours. Lipid Profile No  results for input(s): CHOL, HDL, LDLCALC, TRIG, CHOLHDL, LDLDIRECT in the last 72 hours. Thyroid function studies No results for input(s): TSH, T4TOTAL, T3FREE, THYROIDAB in the last 72 hours.  Invalid input(s): FREET3 Anemia work up No results for input(s): VITAMINB12, FOLATE, FERRITIN, TIBC, IRON, RETICCTPCT in the last 72 hours. Urinalysis    Component Value Date/Time   COLORURINE YELLOW 03/14/2018 [place] 03/14/2018 0219   LABSPEC 1.011 03/14/2018 0219   PHURINE 6.0 03/14/2018 0219   GLUCOSEU NEGATIVE 03/14/2018 0219   HGBUR NEGATIVE 03/14/2018 0219   BILIRUBINUR NEGATIVE 03/14/2018 0219   KETONESUR NEGATIVE 03/14/2018 0219   PROTEINUR NEGATIVE 03/14/2018 0219   NITRITE NEGATIVE 03/14/2018 8563  LEUKOCYTESUR NEGATIVE 03/14/2018 0219   Sepsis Labs Invalid input(s): PROCALCITONIN,  WBC,  LACTICIDVEN Microbiology Recent Results (from the past 240 hour(s))  Blood Culture (routine x 2)     Status: None (Preliminary result)   Collection Time: 03/14/18  1:02 AM  Result Value Ref Range Status   Specimen Description   Final    BLOOD LEFT HAND Performed at [facility] [address], [place], [place] [address]    Special Requests   Final    BOTTLES DRAWN AEROBIC AND ANAEROBIC Blood Culture adequate volume Performed at [facility] [address] [place], [place] [address]    Culture   Final    NO GROWTH 1 DAY Performed at [facility], [place] [address], [place], [place] [address]    Report Status PENDING  Incomplete  Blood Culture (routine x 2)     Status: None (Preliminary result)   Collection Time: 03/14/18  1:02 AM  Result Value Ref Range Status   Specimen Description   Final    BLOOD RIGHT HAND Performed at [facility] [address], [place], [place] [address]    Special Requests   Final    BOTTLES DRAWN AEROBIC AND ANAEROBIC Blood Culture adequate volume Performed at [facility] [address] [place], [place] [address]    Culture   Final    NO GROWTH 1 DAY Performed at [facility], [place] [address] [place], [place] [address]    Report Status PENDING  Incomplete  MRSA PCR Screening     Status: Abnormal   Collection Time: 03/14/18  6:47 AM  Result Value Ref Range Status   MRSA by PCR POSITIVE (A) NEGATIVE Final    Comment:        The GeneXpert MRSA Assay (FDA approved for NASAL specimens only), is one component of a comprehensive MRSA colonization surveillance program. It is not intended to diagnose MRSA infection nor to guide or monitor treatment for MRSA infections. CRITICAL RESULT CALLED TO, READ BACK BY AND VERIFIED WITH: MYRICK,R RN [place] Performed at [facility] [address], [place], [place] [address]   Respiratory Panel by PCR     Status: Abnormal   Collection Time: 03/14/18  6:48 AM  Result Value Ref Range Status   Adenovirus NOT DETECTED NOT DETECTED Final   Coronavirus 229E NOT DETECTED NOT DETECTED Final   Coronavirus HKU1 NOT DETECTED NOT DETECTED Final   Coronavirus NL63 NOT DETECTED NOT DETECTED Final   Coronavirus OC43 NOT DETECTED NOT DETECTED Final   Metapneumovirus NOT DETECTED NOT DETECTED Final   Rhinovirus / Enterovirus DETECTED (A) NOT DETECTED Final   Influenza A NOT DETECTED NOT DETECTED Final   Influenza B NOT DETECTED NOT DETECTED Final   Parainfluenza Virus 1 NOT DETECTED NOT DETECTED Final   Parainfluenza Virus 2 NOT DETECTED NOT DETECTED Final   Parainfluenza Virus 3 NOT DETECTED NOT DETECTED Final   Parainfluenza Virus 4 NOT DETECTED NOT DETECTED Final   Respiratory Syncytial Virus NOT DETECTED NOT DETECTED Final   Bordetella pertussis NOT DETECTED NOT DETECTED Final   Chlamydophila pneumoniae NOT DETECTED NOT DETECTED Final   Mycoplasma pneumoniae NOT DETECTED NOT DETECTED Final    Comment: Performed at [facility], [address],  [place], [place] [address]  Culture, sputum-assessment     Status: None   Collection Time: 03/14/18  7:52 AM  Result Value Ref Range  Status   Specimen Description SPUTUM  Final   Special Requests NONE  Final   Sputum evaluation   Final    THIS SPECIMEN IS ACCEPTABLE FOR SPUTUM CULTURE Performed at [facility], [place] [address], [place], [place] [address]    Report Status 03/14/2018 FINAL  Final  Culture, respiratory     Status: None (Preliminary result)   Collection Time: 03/14/18  7:52 AM  Result Value Ref Range Status   Specimen Description   Final    SPUTUM Performed at [facility] [address], [place], [place] [address]    Special Requests   Final    NONE Reflexed from [id number] Performed at [facility], [place] [address] [place], [place] [address]    Gram Stain   Final    RARE WBC PRESENT, PREDOMINANTLY PMN RARE GRAM POSITIVE COCCI RARE GRAM NEGATIVE RODS RARE GRAM POSITIVE RODS RARE YEAST    Culture   Final    CULTURE REINCUBATED FOR BETTER GROWTH Performed at [facility], [place] [address] [place], [place] [address]    Report Status PENDING  Incomplete   Time coordinating discharge: 35 minutes  SIGNED:  Kerney Elbe, DO [facility] 03/16/2018, 1:36 PM Pager is on [place]  If 7PM-7AM, please contact night-coverage [email or web address] Password [email or web address]

## 2018-03-17 LAB — CULTURE, RESPIRATORY: CULTURE: NORMAL

## 2018-03-19 LAB — CULTURE, BLOOD (ROUTINE X 2)
CULTURE: NO GROWTH
Culture: NO GROWTH
Special Requests: ADEQUATE
Special Requests: ADEQUATE

## 2020-02-13 IMAGING — CR DG CHEST 2V
2 series · 2 of 2 positions shown · non-contrast
Comparison: None.

CLINICAL DATA: 57-year-old female with chest pain and shortness of
breath.

EXAM:
CHEST - 2 VIEW

[w chest lat]
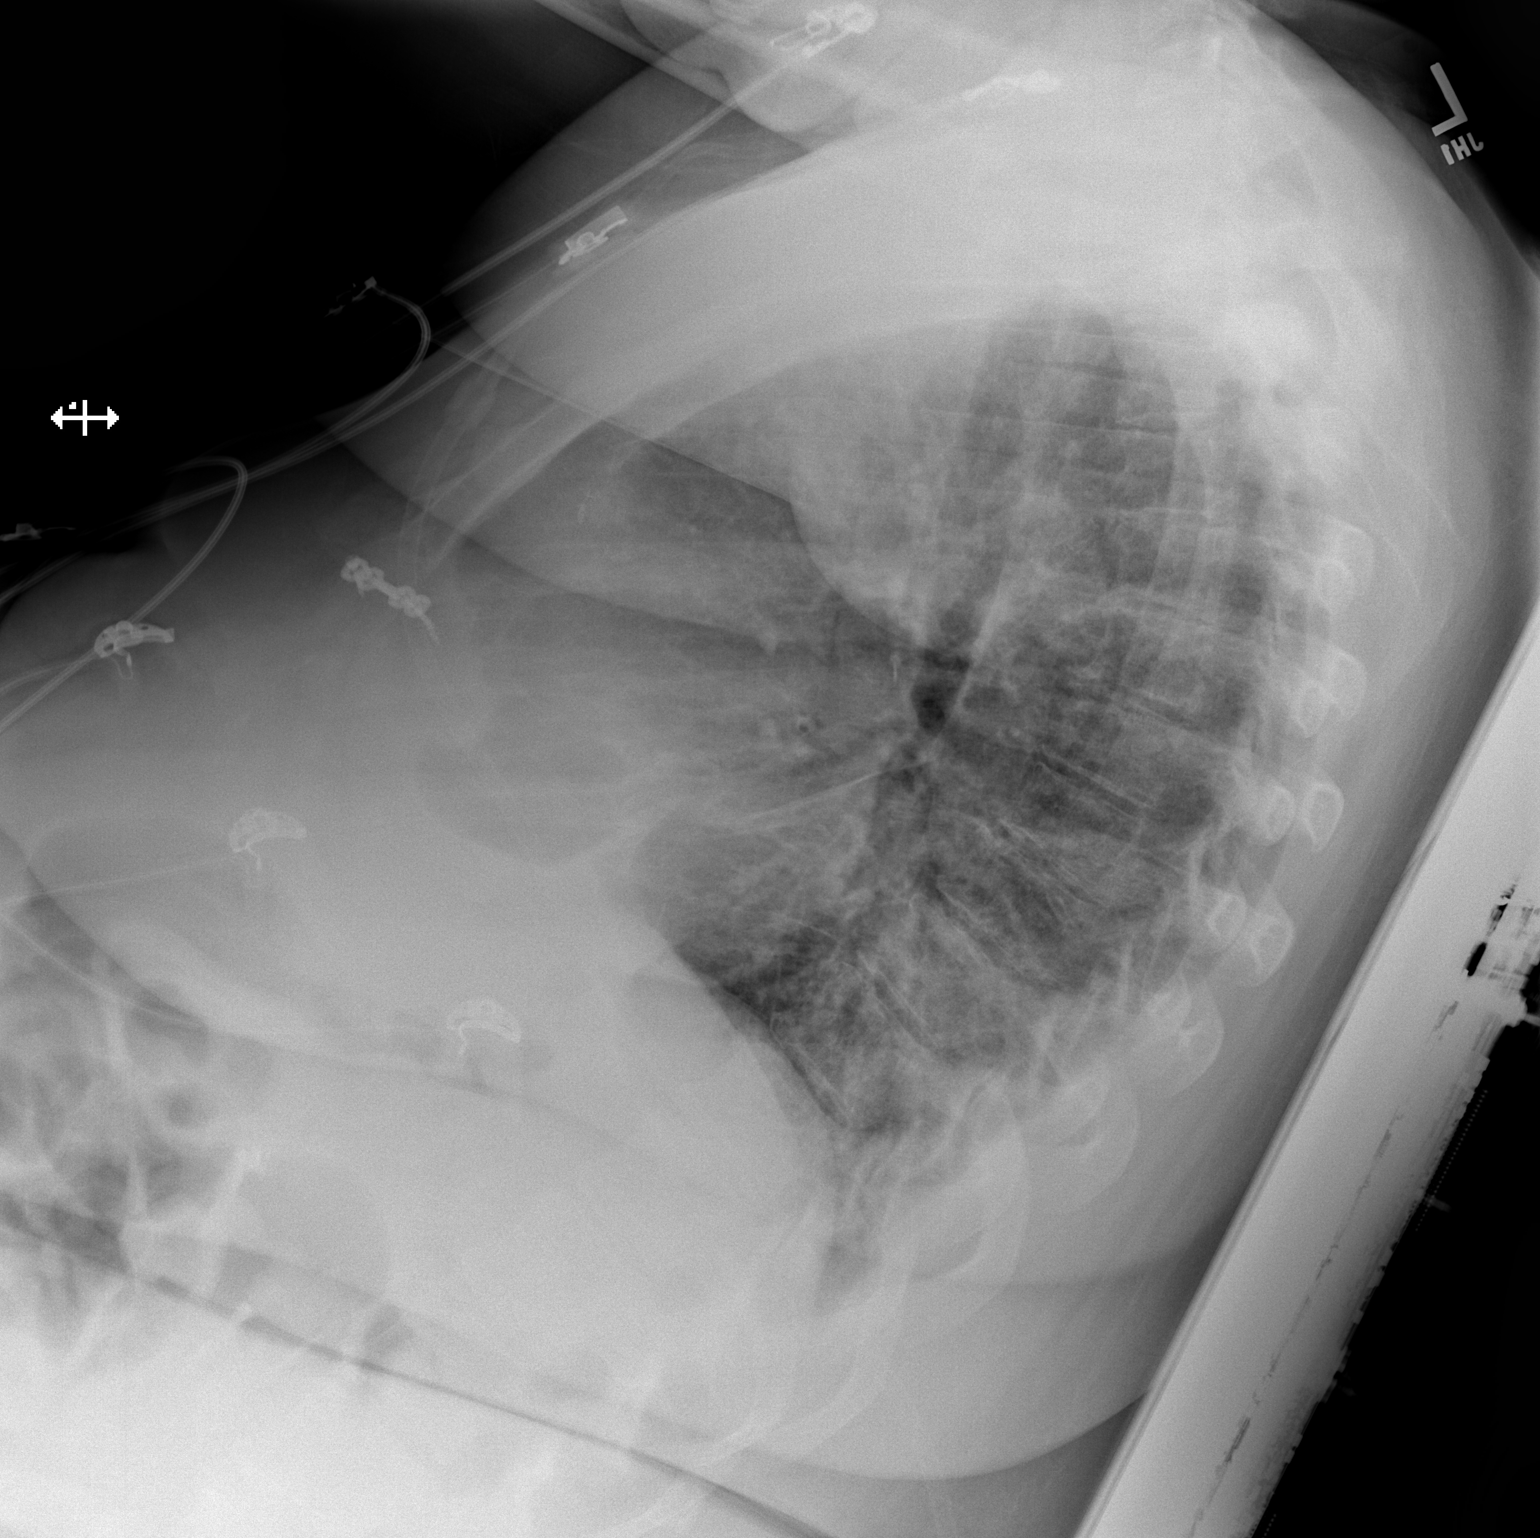

[x chest ap]
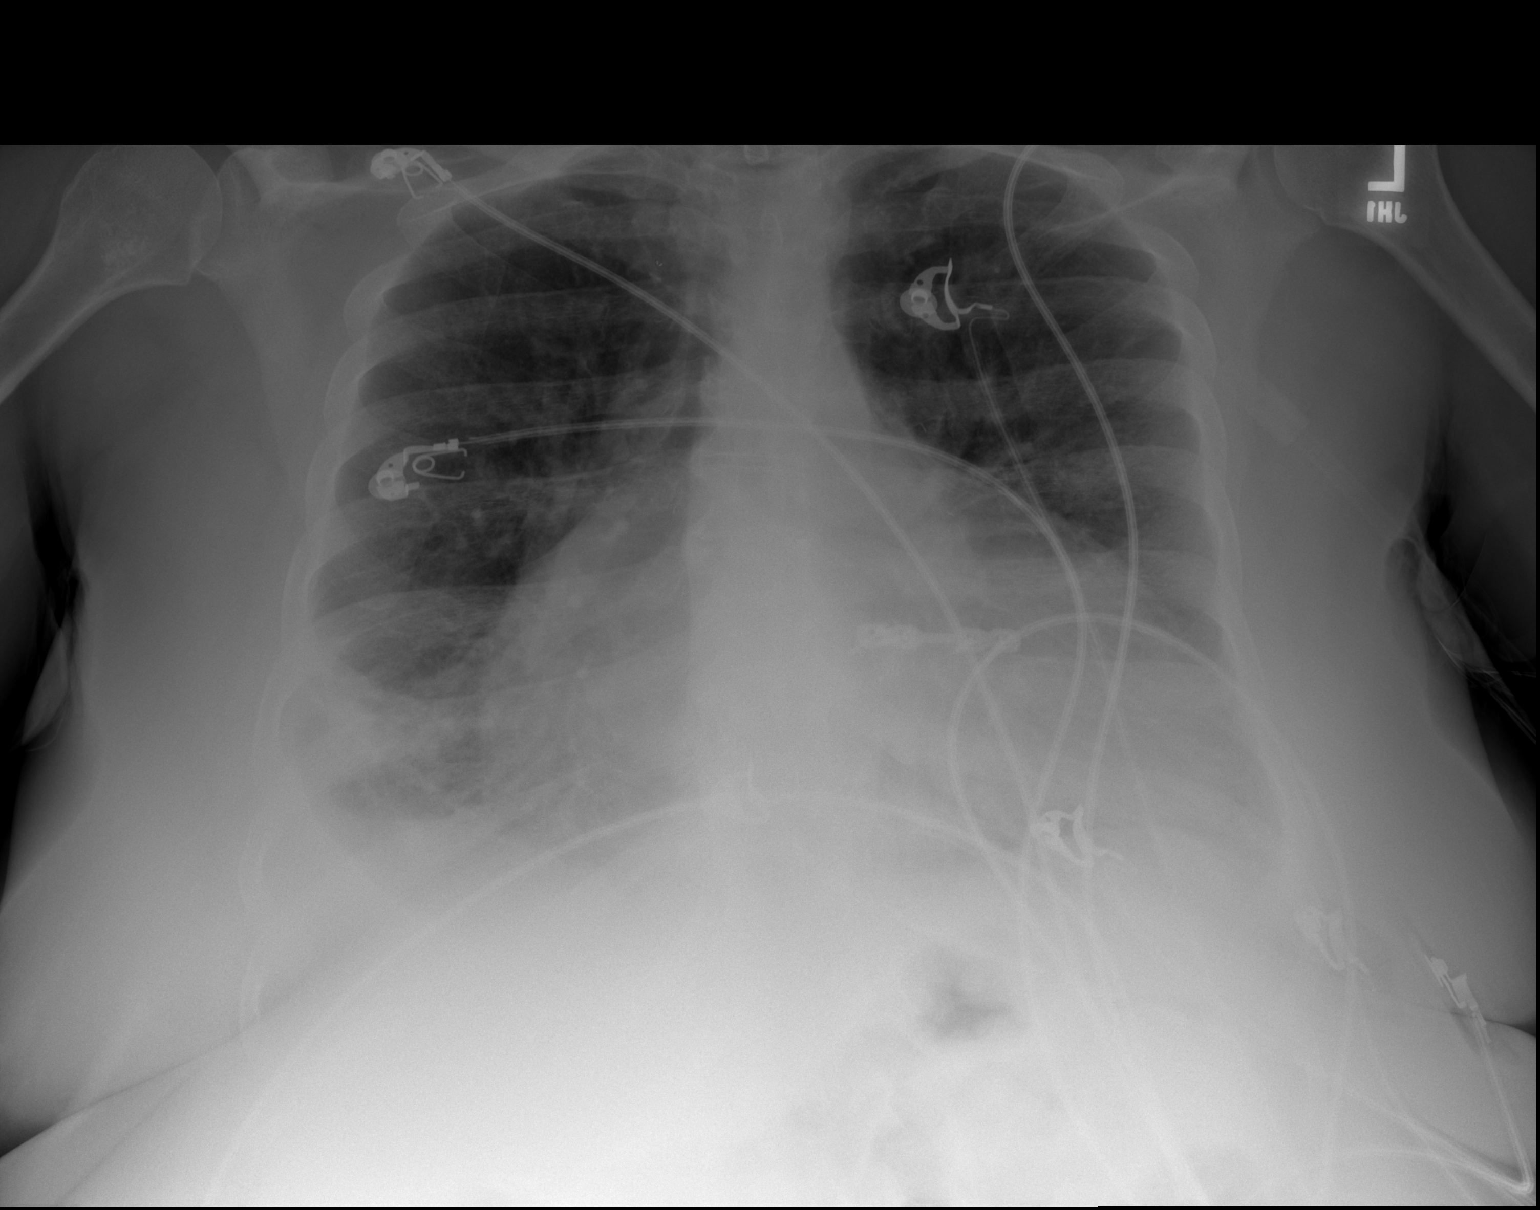

[2 of 2 positions shown; findings below may reference images not displayed]

FINDINGS: Probable small bilateral pleural effusions and associated
atelectatic changes of the lung bases versus infiltrate. No
pneumothorax. There is cardiomegaly. No acute osseous pathology.
IMPRESSION: 1. Small bilateral pleural effusions and bibasilar atelectasis
versus infiltrate.
2. Cardiomegaly.

## 2020-02-15 IMAGING — DX DG CHEST 1V PORT
1 series · 1 of 1 positions shown · non-contrast
Comparison: March 13, 2018

CLINICAL DATA: Shortness of breath.

EXAM:
PORTABLE CHEST 1 VIEW

[chest ap]
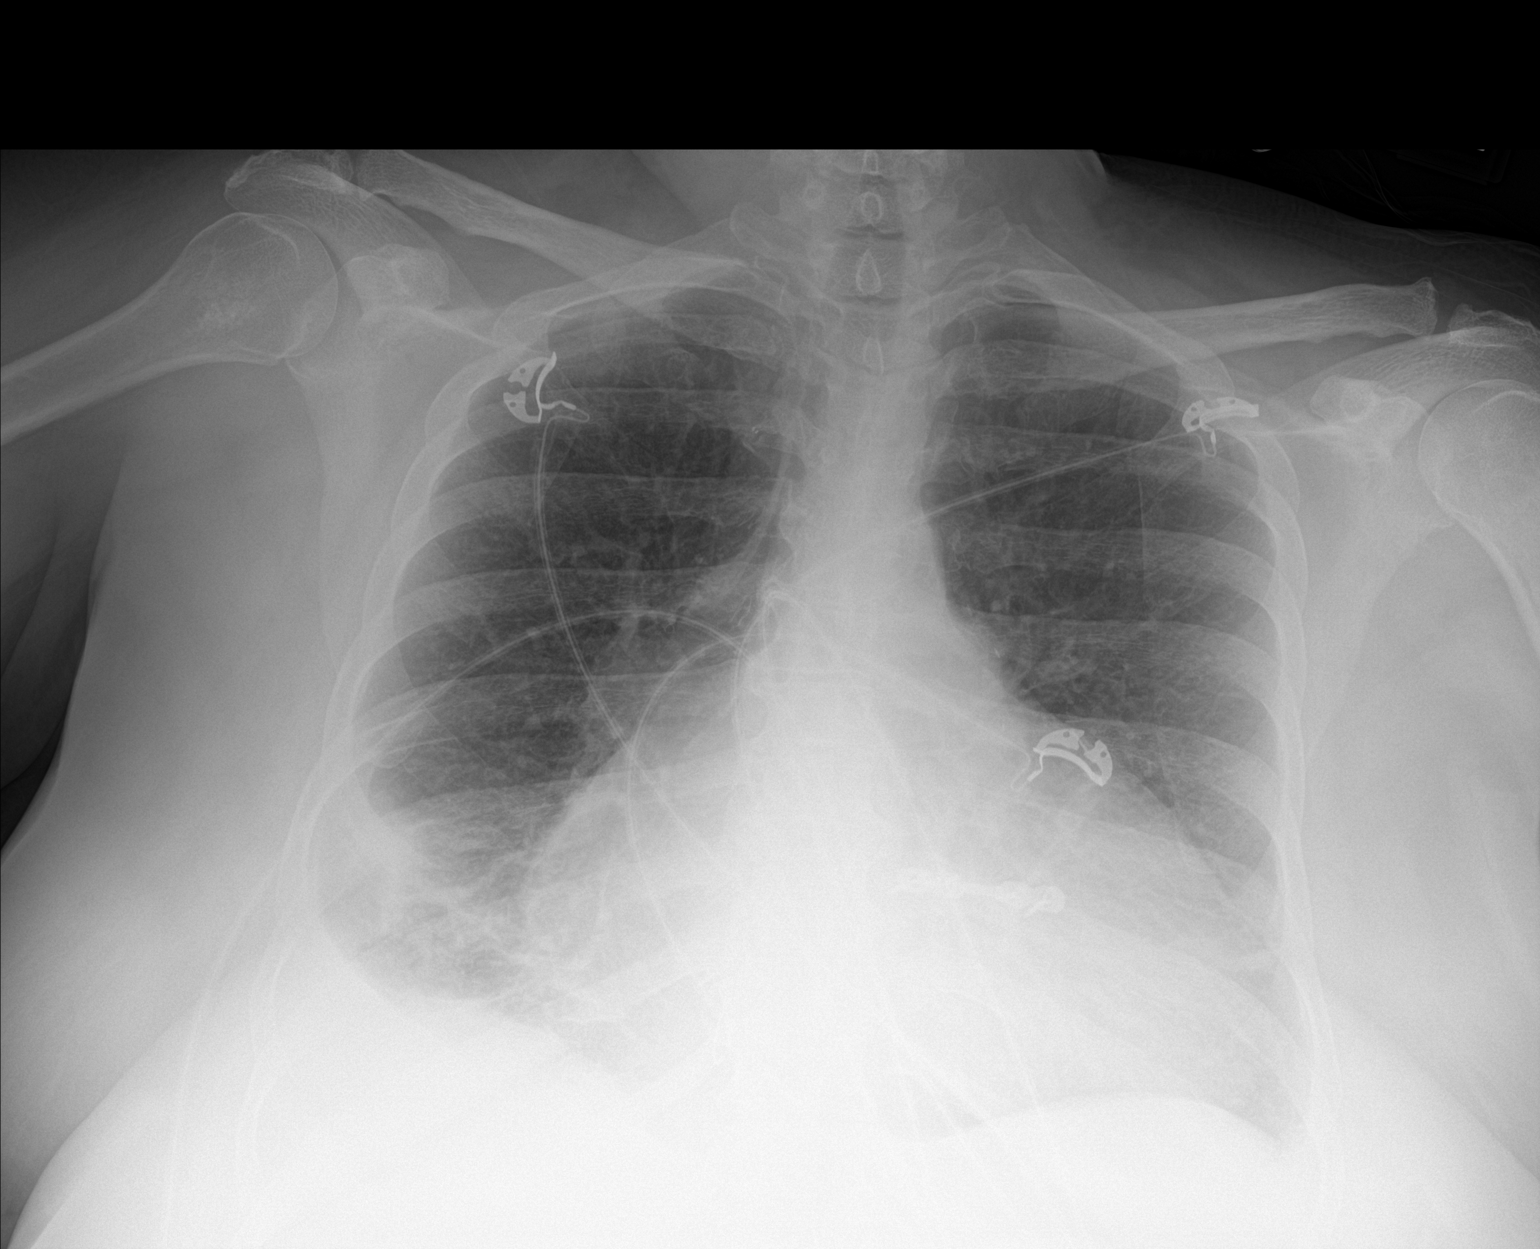

[1 of 1 positions shown; findings below may reference images not displayed]

FINDINGS: Cardiomegaly. Small right effusion with underlying opacity, stable.
No other interval changes.
IMPRESSION: No interval change in cardiomegaly, the right-sided pleural
effusion, or the opacity underlying the right pleural effusion.

## 2021-09-15 DEATH — deceased

## 2021-11-15 DEATH — deceased
